# Patient Record
Sex: Female | Born: 1975 | Race: White | Hispanic: No | Marital: Married | Smoking: Current every day smoker
Health system: Southern US, Academic
[De-identification: ages and names within clinical notes are randomized; demographics above are authoritative.]

## PROBLEM LIST (undated history)

## (undated) DIAGNOSIS — F429 Obsessive-compulsive disorder, unspecified: Secondary | ICD-10-CM

## (undated) DIAGNOSIS — M539 Dorsopathy, unspecified: Secondary | ICD-10-CM

## (undated) DIAGNOSIS — F339 Major depressive disorder, recurrent, unspecified: Secondary | ICD-10-CM

## (undated) DIAGNOSIS — R6889 Other general symptoms and signs: Secondary | ICD-10-CM

## (undated) DIAGNOSIS — F4310 Post-traumatic stress disorder, unspecified: Secondary | ICD-10-CM

## (undated) DIAGNOSIS — Z8719 Personal history of other diseases of the digestive system: Secondary | ICD-10-CM

## (undated) DIAGNOSIS — E119 Type 2 diabetes mellitus without complications: Secondary | ICD-10-CM

## (undated) DIAGNOSIS — M5090 Cervical disc disorder, unspecified, unspecified cervical region: Secondary | ICD-10-CM

## (undated) DIAGNOSIS — F411 Generalized anxiety disorder: Secondary | ICD-10-CM

## (undated) DIAGNOSIS — Z973 Presence of spectacles and contact lenses: Secondary | ICD-10-CM

## (undated) DIAGNOSIS — G8929 Other chronic pain: Secondary | ICD-10-CM

## (undated) DIAGNOSIS — S0300XA Dislocation of jaw, unspecified side, initial encounter: Secondary | ICD-10-CM

## (undated) DIAGNOSIS — R790 Abnormal level of blood mineral: Secondary | ICD-10-CM

## (undated) DIAGNOSIS — E559 Vitamin D deficiency, unspecified: Secondary | ICD-10-CM

## (undated) DIAGNOSIS — R519 Headache, unspecified: Secondary | ICD-10-CM

## (undated) DIAGNOSIS — D509 Iron deficiency anemia, unspecified: Secondary | ICD-10-CM

## (undated) DIAGNOSIS — R197 Diarrhea, unspecified: Secondary | ICD-10-CM

## (undated) DIAGNOSIS — H8110 Benign paroxysmal vertigo, unspecified ear: Secondary | ICD-10-CM

## (undated) DIAGNOSIS — F32A Depression, unspecified: Secondary | ICD-10-CM

## (undated) DIAGNOSIS — K219 Gastro-esophageal reflux disease without esophagitis: Secondary | ICD-10-CM

## (undated) DIAGNOSIS — Z87898 Personal history of other specified conditions: Secondary | ICD-10-CM

## (undated) DIAGNOSIS — R7303 Prediabetes: Secondary | ICD-10-CM

## (undated) DIAGNOSIS — E538 Deficiency of other specified B group vitamins: Secondary | ICD-10-CM

## (undated) DIAGNOSIS — F419 Anxiety disorder, unspecified: Secondary | ICD-10-CM

## (undated) DIAGNOSIS — K589 Irritable bowel syndrome without diarrhea: Secondary | ICD-10-CM

## (undated) DIAGNOSIS — F410 Panic disorder [episodic paroxysmal anxiety] without agoraphobia: Secondary | ICD-10-CM

## (undated) HISTORY — DX: Prediabetes: R73.03

## (undated) HISTORY — PX: ESOPHAGOGASTRODUODENOSCOPY: SHX1529

## (undated) HISTORY — DX: Generalized anxiety disorder: F41.1

## (undated) HISTORY — DX: Cervical disc disorder, unspecified, unspecified cervical region: M50.90

## (undated) HISTORY — DX: Abnormal level of blood mineral: R79.0

## (undated) HISTORY — DX: Panic disorder (episodic paroxysmal anxiety): F41.0

## (undated) HISTORY — DX: Personal history of other specified conditions: Z87.898

## (undated) HISTORY — DX: Depression, unspecified: F32.A

## (undated) HISTORY — PX: COLONOSCOPY: WVUENDOPRO10

## (undated) HISTORY — DX: Deficiency of other specified B group vitamins: E53.8

## (undated) HISTORY — DX: Iron deficiency anemia, unspecified: D50.9

## (undated) HISTORY — DX: Dislocation of jaw, unspecified side, initial encounter: S03.00XA

## (undated) HISTORY — DX: Personal history of other diseases of the digestive system: Z87.19

## (undated) HISTORY — DX: Major depressive disorder, recurrent, unspecified: F33.9

## (undated) HISTORY — DX: Headache, unspecified: R51.9

## (undated) HISTORY — PX: HX TUBAL LIGATION: SHX77

## (undated) HISTORY — DX: Gastro-esophageal reflux disease without esophagitis: K21.9

## (undated) HISTORY — DX: Post-traumatic stress disorder, unspecified: F43.10

## (undated) HISTORY — PX: HX HYSTERECTOMY: SHX81

## (undated) HISTORY — DX: Vitamin D deficiency, unspecified: E55.9

## (undated) HISTORY — DX: Other chronic pain: G89.29

## (undated) HISTORY — DX: Obsessive-compulsive disorder, unspecified: F42.9

---
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
## 2018-11-29 IMAGING — US EXTREMITY
1 series · 13 of 13 positions shown · non-contrast
Comparison: None available.
COMPARISON: None available.

------------- REPORT [id number] -------------
EXAM:  EXTREMITY
INDICATION: Left foot suspected foreign body.
INDICATION: Left foot pain. Left foot suspected foreign body.

[Series 4: extremity · 0.05mm/px · 13 of 13 slices shown]
[im 1/13]
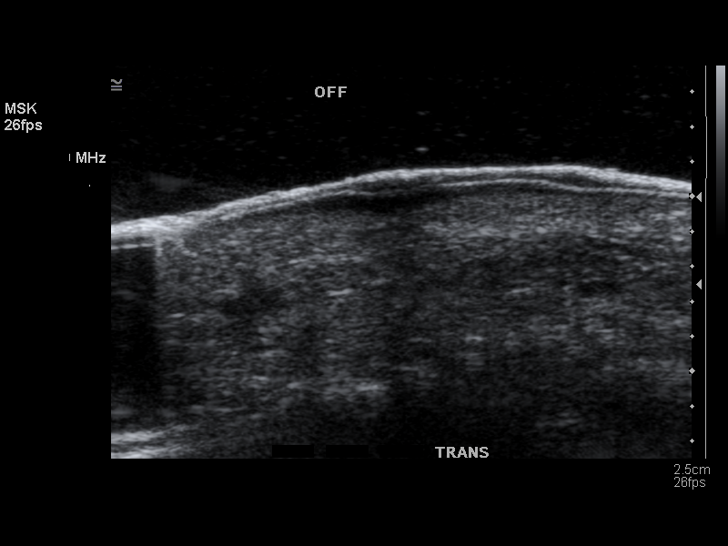
[im 2/13]
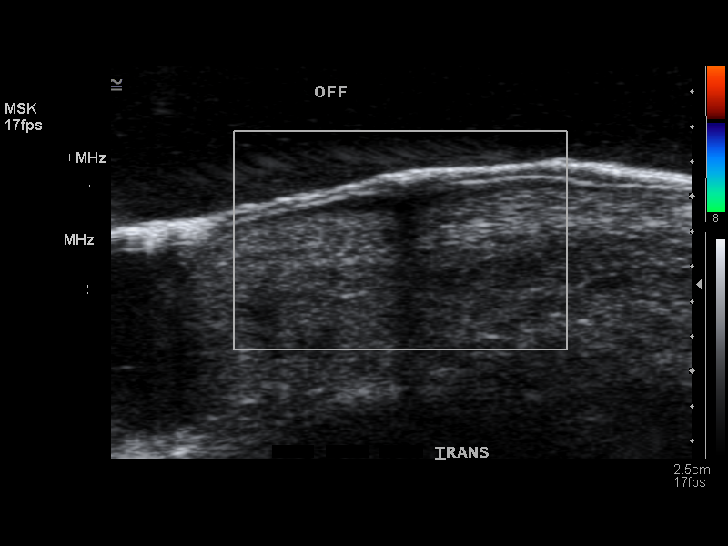
[im 3/13]
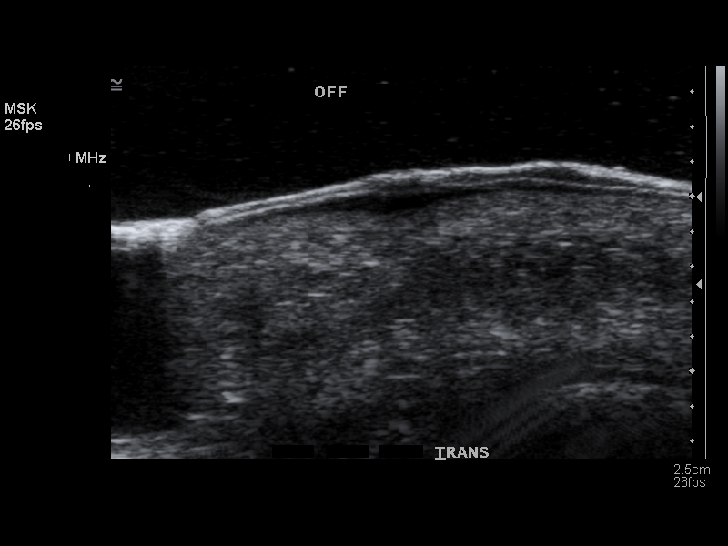
[im 4/13]
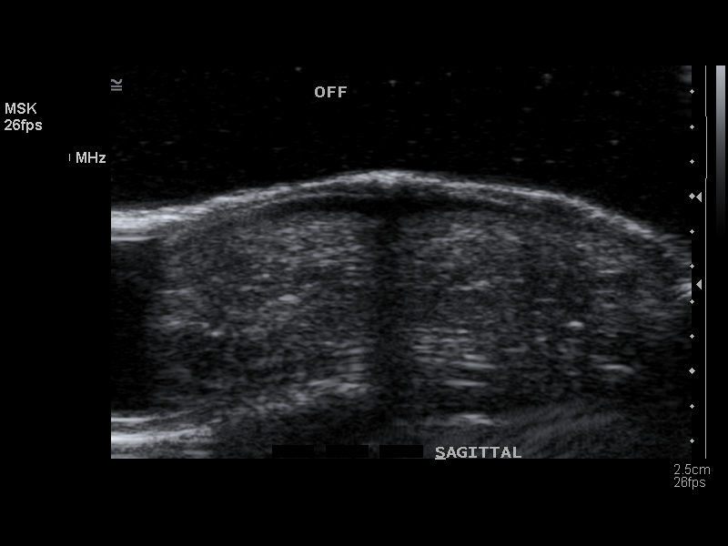
[im 5/13]
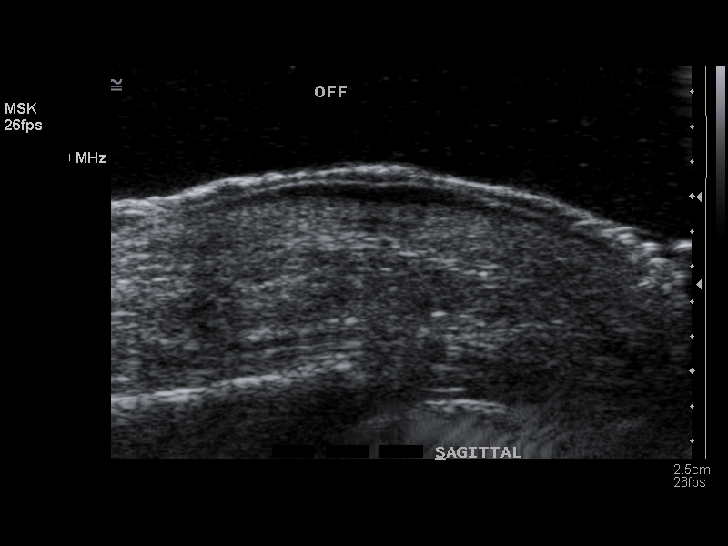
[im 6/13]
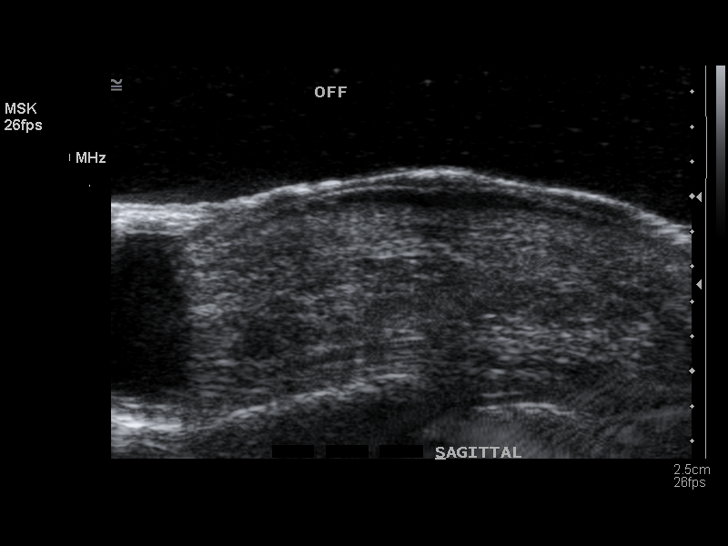
[im 7/13]
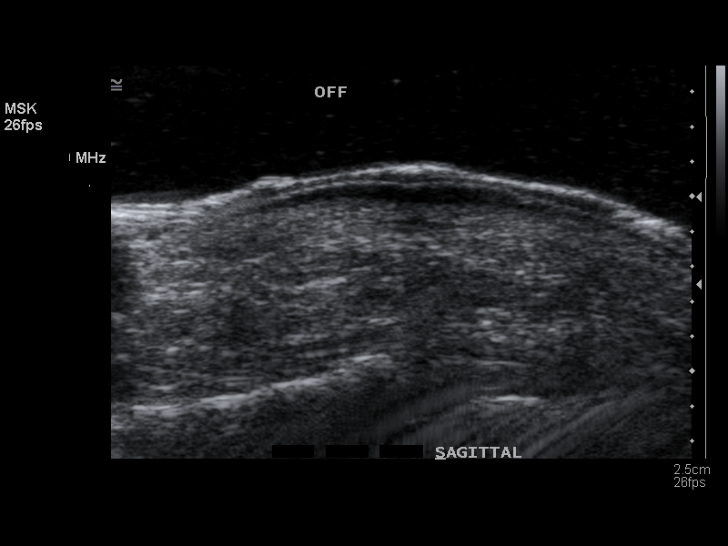
[im 8/13]
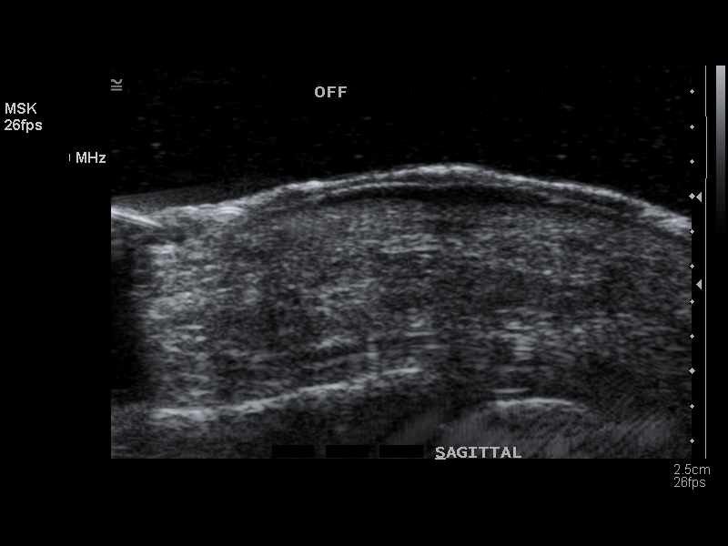
[im 9/13]
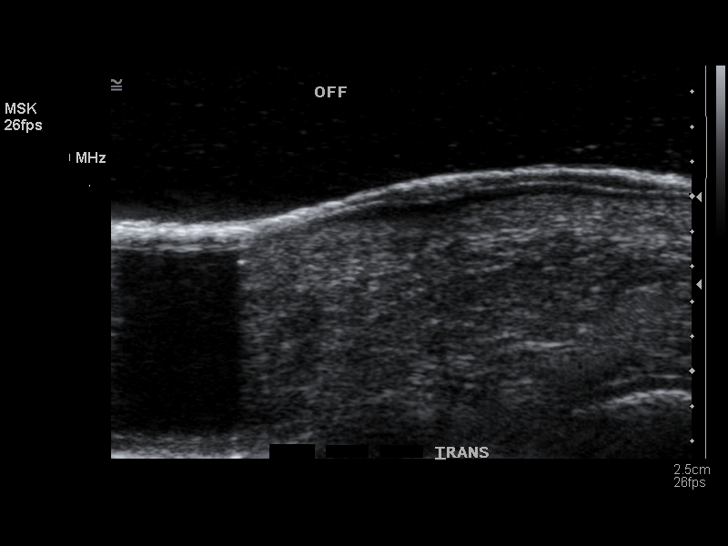
[im 10/13]
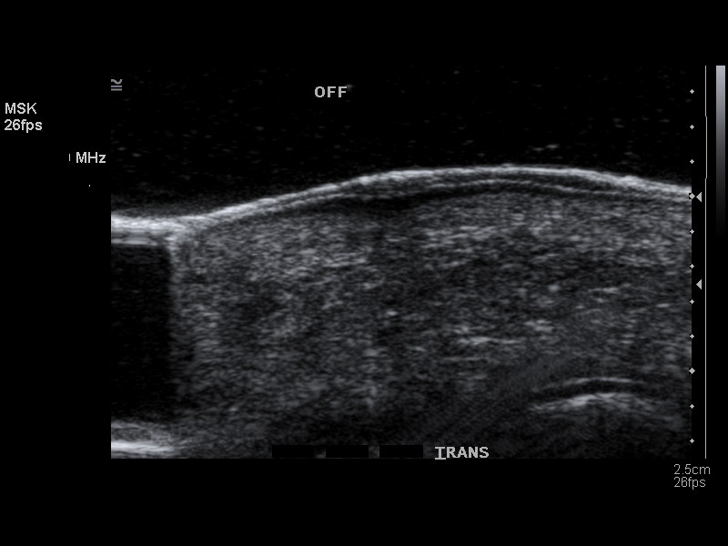
[im 11/13]
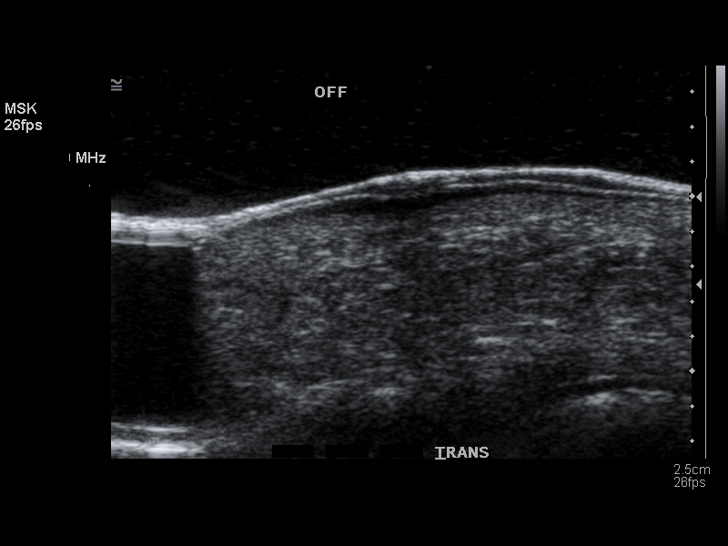
[im 12/13]
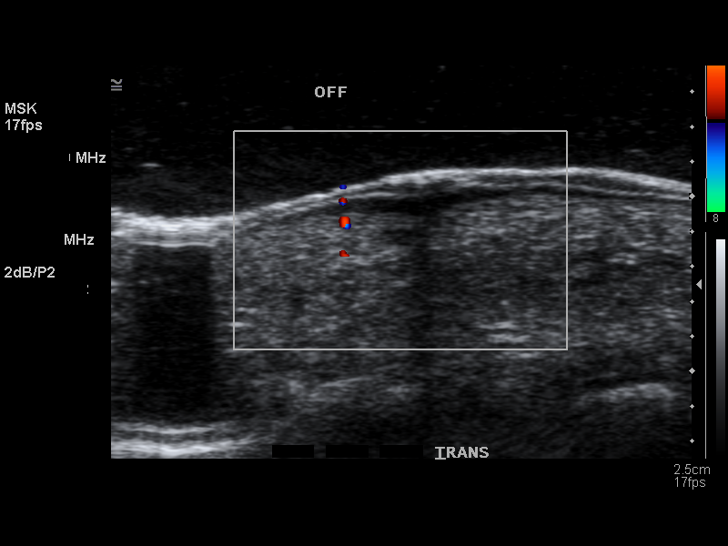
[im 13/13]
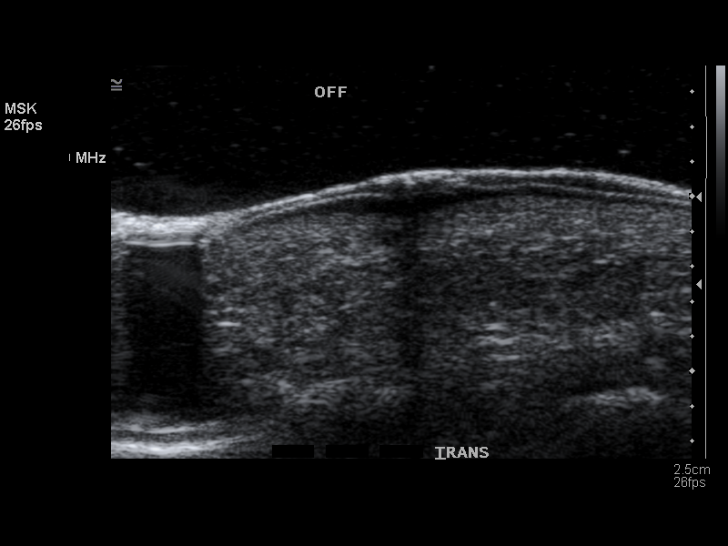

[13 of 13 positions shown; findings below may reference images not displayed]

FINDINGS: Limited sonographic evaluation of the left foot plantar surface, at the level of the 3rd and 4th metatarsophalangeal joints, demonstrates normal soft tissues. No suspicious solid or cystic mass is seen. There is no definite foreign body.
IMPRESSION: Unremarkable limited exam. Continued clinical follow-up is recommended.

------------- REPORT [id number] -------------
EXAM:  EXTREMITY
FINDINGS: Limited sonographic evaluation of the left foot plantar surface, at the level of the 3rd and 4th metatarsophalangeal joints, demonstrates normal soft tissues. No suspicious solid or cystic mass is seen. There is no definite foreign body.
IMPRESSION: Unremarkable limited exam. Continued clinical follow-up is recommended.

## 2018-11-29 IMAGING — CR XRAY FOOT COMPLETE LT
1 series · 3 of 3 positions shown · non-contrast
Comparison: None available.

EXAM:  XRAY FOOT COMPLETE LT
INDICATION: Left foot pain. Suspected foreign body.

[Series 1: view not recorded · 0.17mm/px · 3 of 3 slices shown]
[im 1/3]
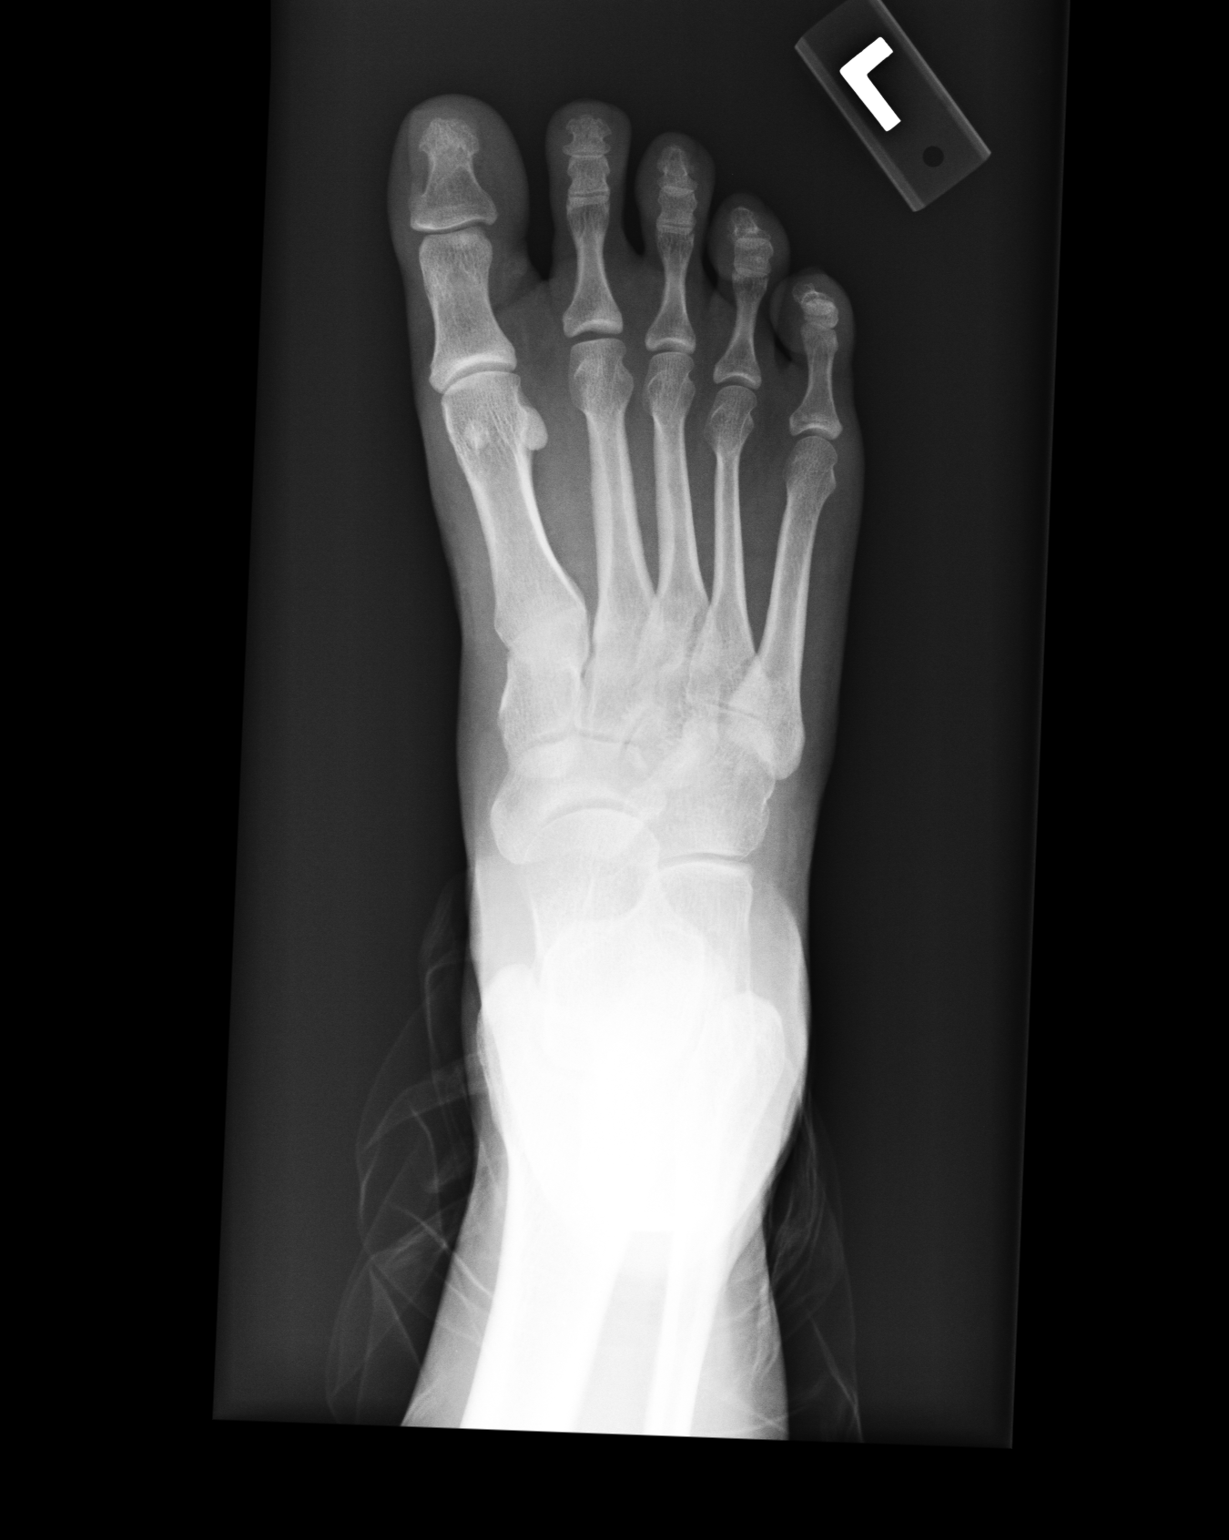
[im 2/3]
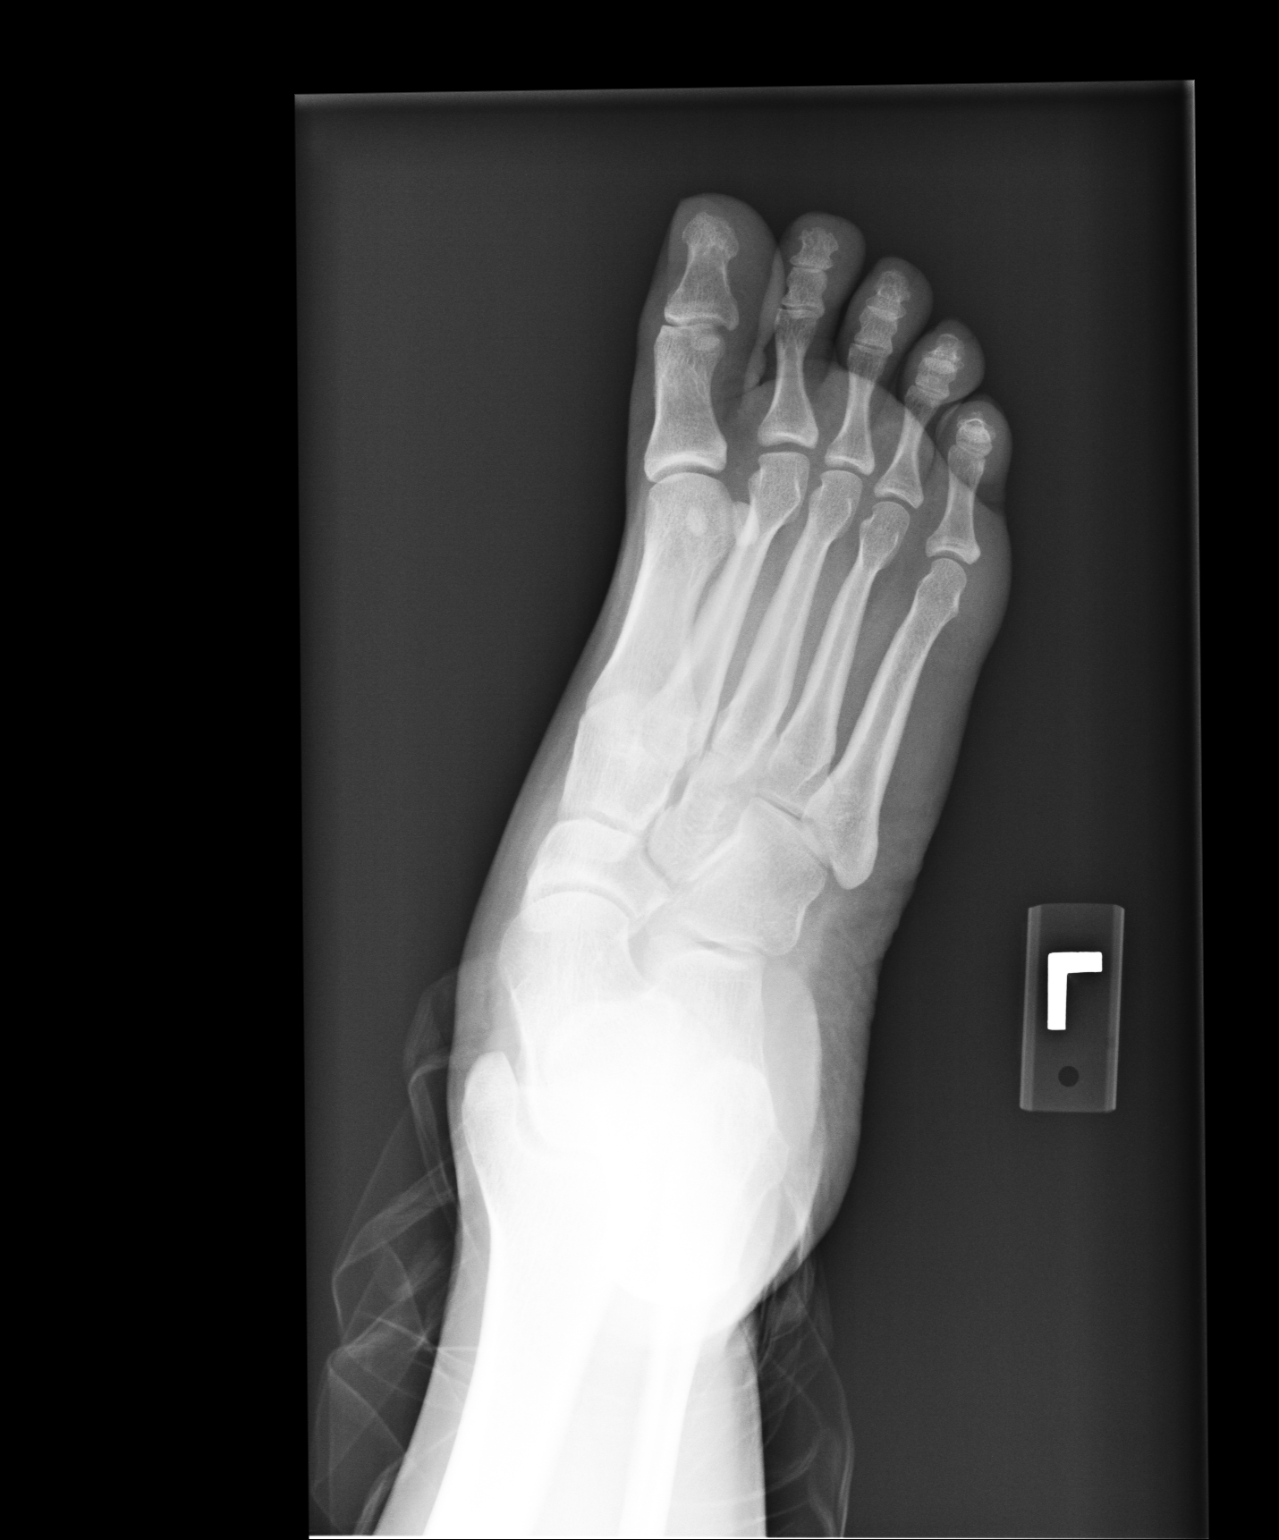
[im 3/3]
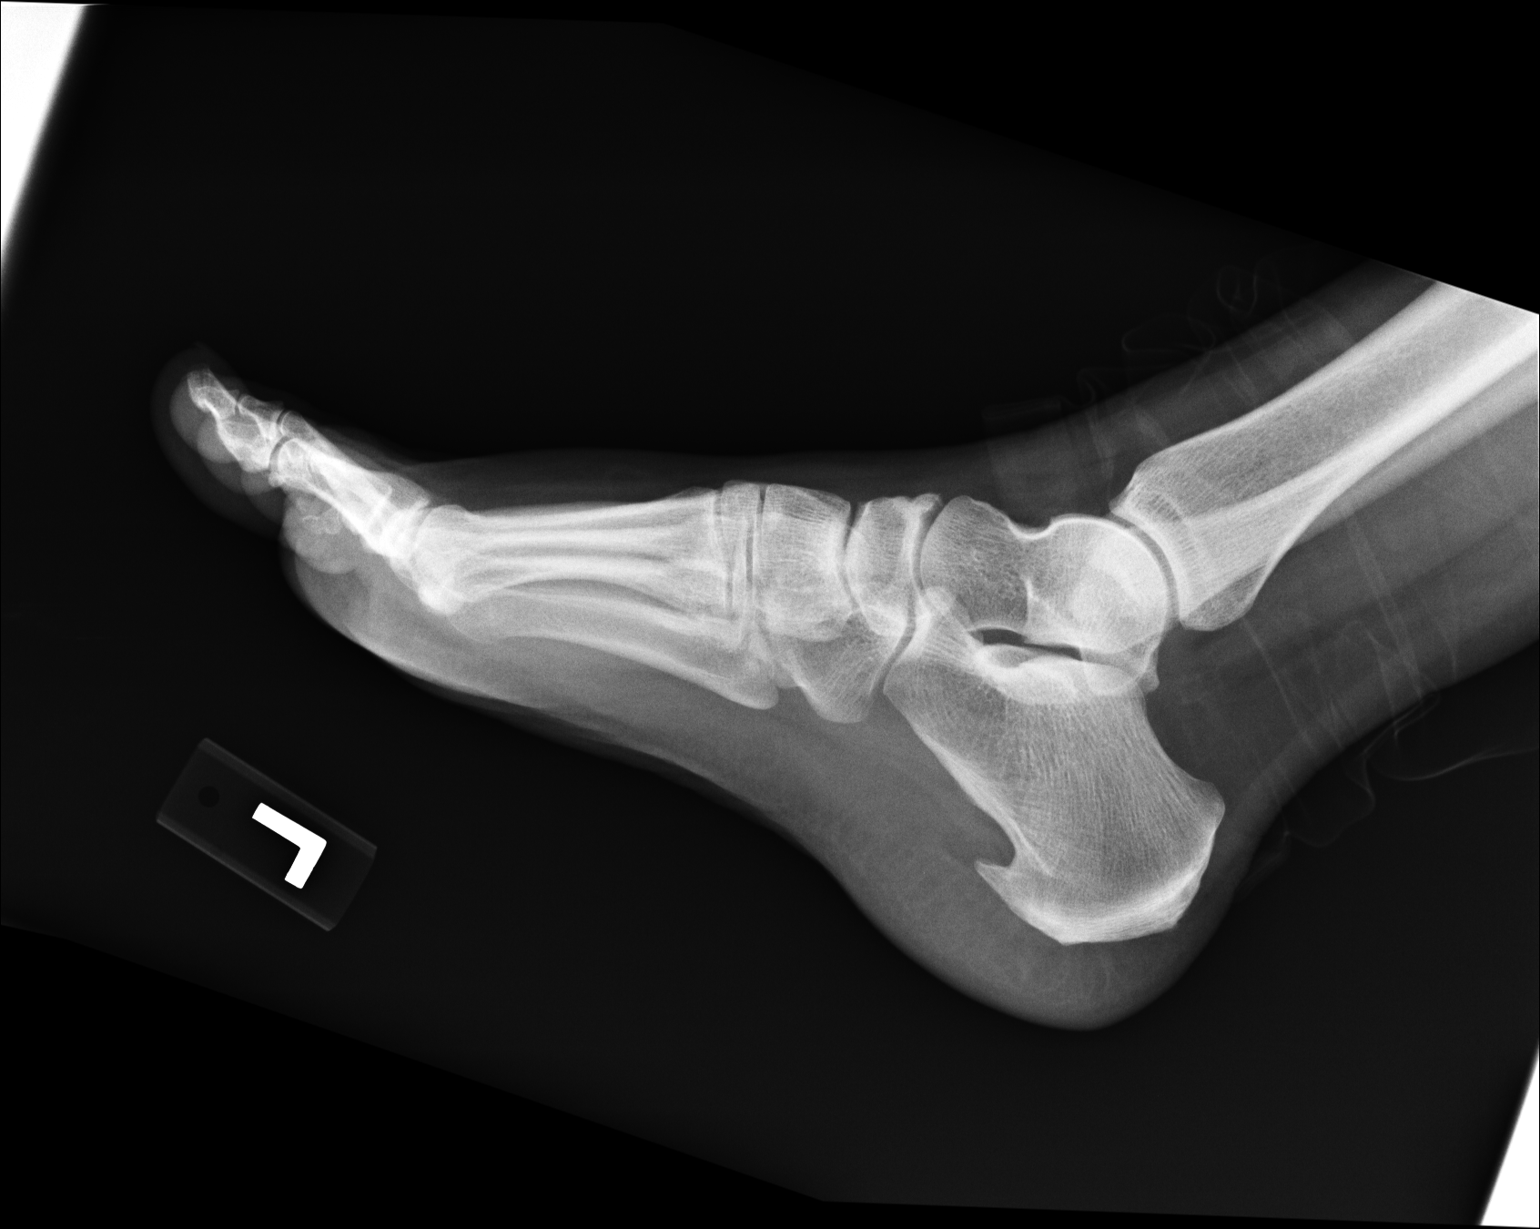

[3 of 3 positions shown; findings below may reference images not displayed]

FINDINGS: There is no acute fracture or subluxation. No significant arthritic changes seen. There is no radiopaque foreign body. A heel spur is noted. There is no soft tissue abnormality.
IMPRESSION: Unremarkable exam.

## 2019-10-04 HISTORY — PX: ESOPHAGOGASTRODUODENOSCOPY: SHX1529

## 2019-10-04 HISTORY — PX: COLONOSCOPY: WVUENDOPRO10

## 2020-02-05 IMAGING — MG 3D SCREENING MAMMO BIL W/CAD
5 series · 8 of 24 positions shown · non-contrast
Comparison: None.  This is a baseline exam.

------------- REPORT [id number] -------------
[facility]
[address]
Daina Ms.DEDIOS, JOSHUAH:
We wish to report the following on your recent mammography examination. We are sending a report to your referring physician or other health care provider. 
(       Normal/Negative:
No evidence of cancer.
This statement is mandated by the [place], Department of Health.
Your examination was performed by one of our technologists, who are registered radiological technologists and also specially certified in mammography:
___
Parlak, Edaly (M)
Nepomuceno, Martinez (M)

Your mammogram was interpreted by our radiologist.
( 
Sofeine Made, M.D.
(Annual Breast Examination by a physician or other health care provider
(Annual Mammography Screening beginning at age 40
(Monthly Breast Self Examination
------------- REPORT [id number] -------------
CLEAR, YOUNGZY
EMMANUELLA HAGEN,FNP
EXAM:  3D BILATERAL ANNUAL SCREENING DIGITAL MAMMOGRAM WITH CAD AND TOMOSYNTHESIS
INDICATION: Screening.

[R]
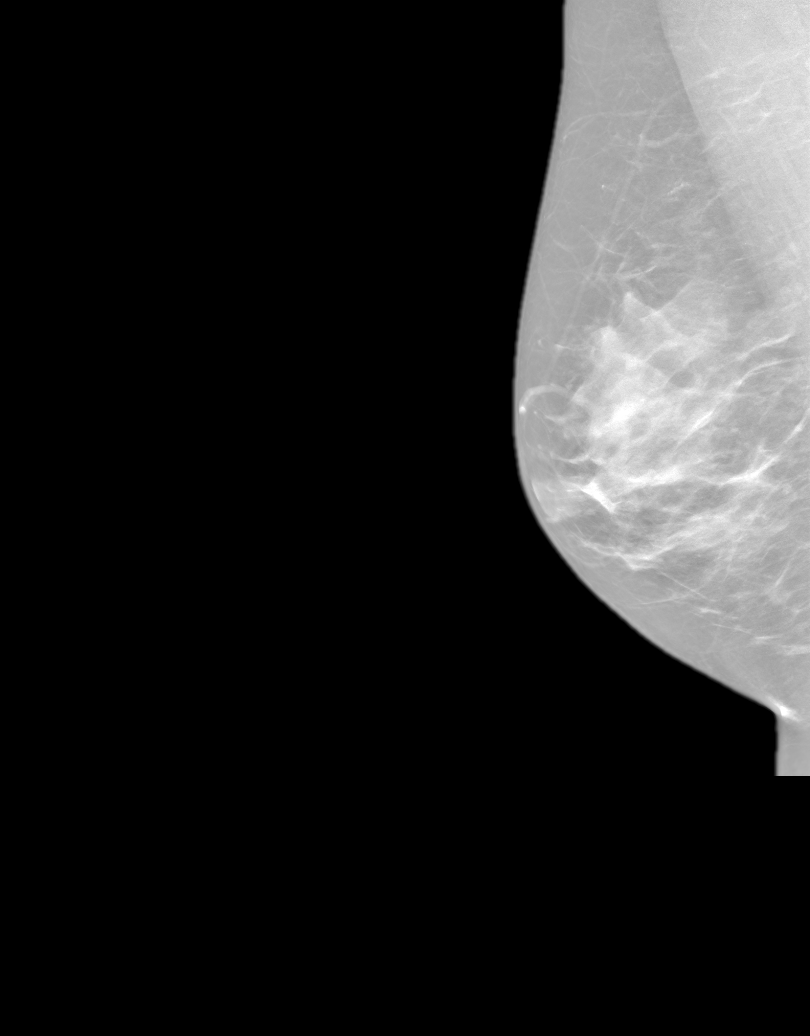

[Series 3519: R CC · right · 2 of 4 slices shown]
[im 1/4]
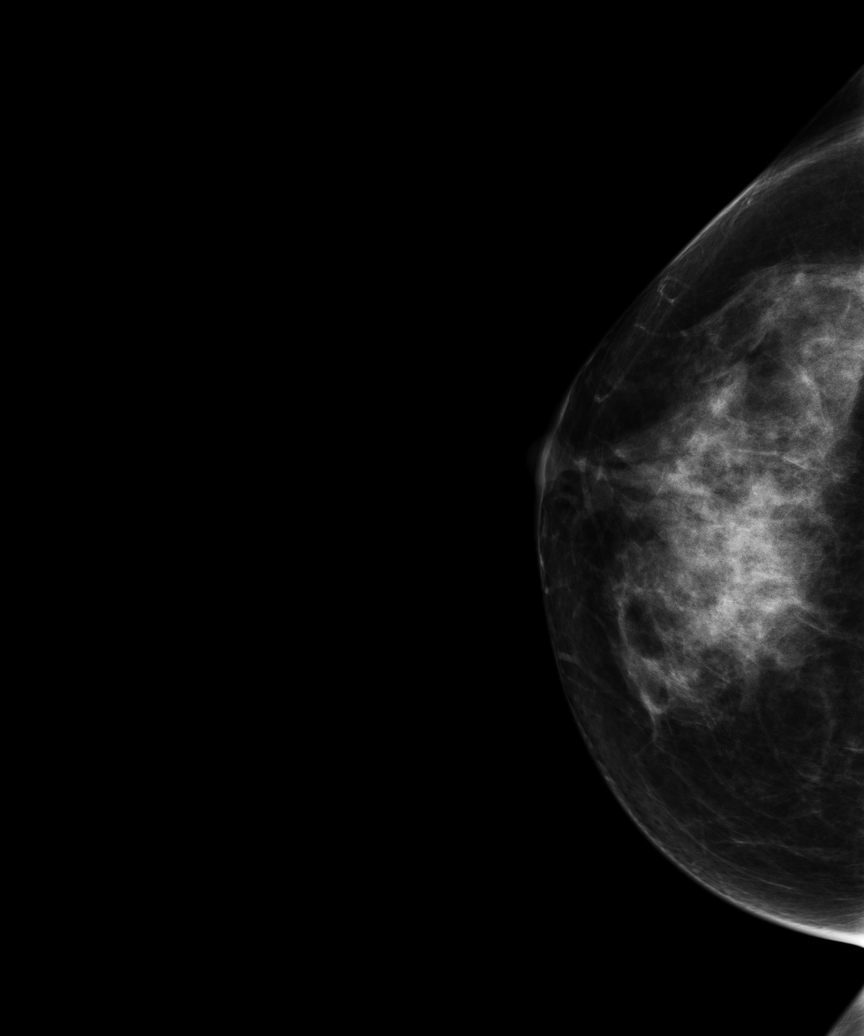
[im 4/4]
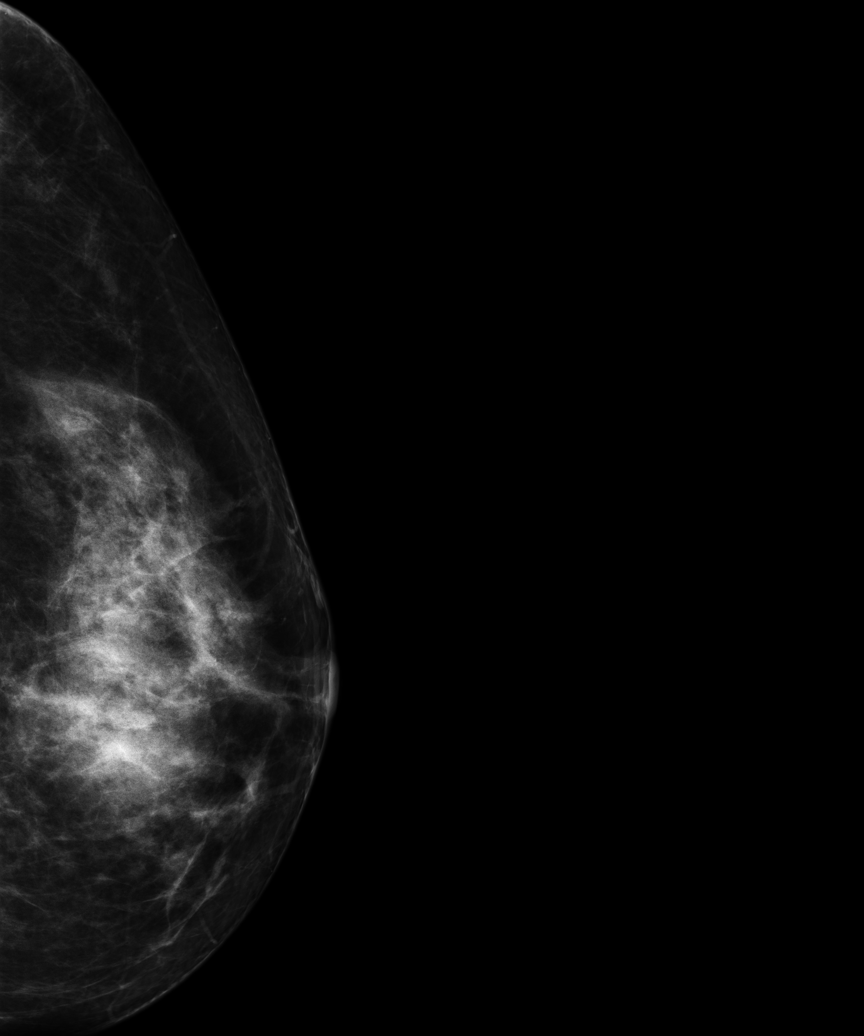

[Series 3521: 3D SCREENING MAMMO BIL W/CAD · 2 acquisitions, 3 frames shown (1 of 2)]
[im 1/2]
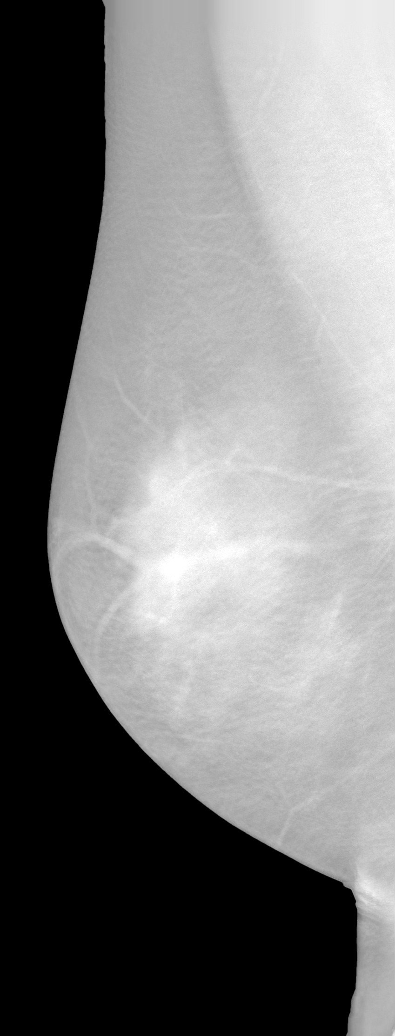
[im 2/2]
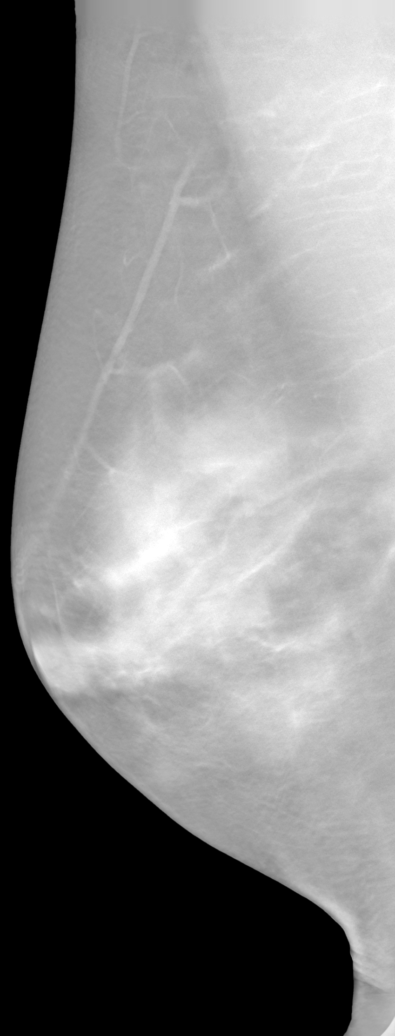
[im 2/2]
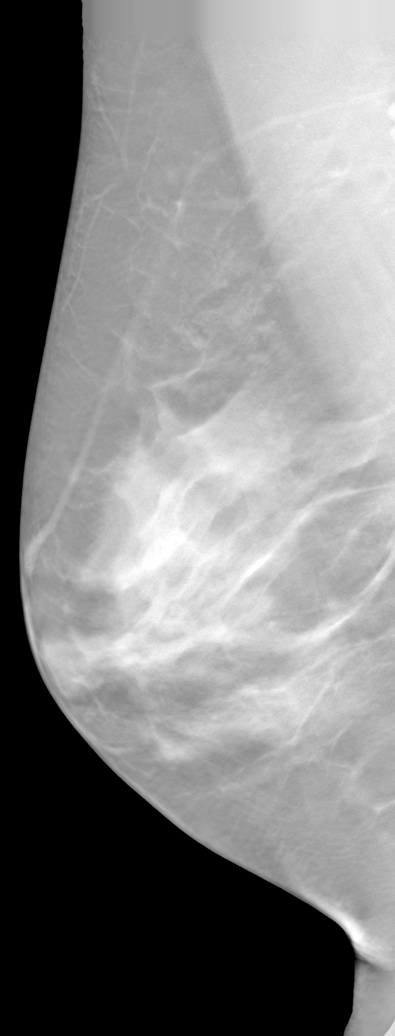

[L]
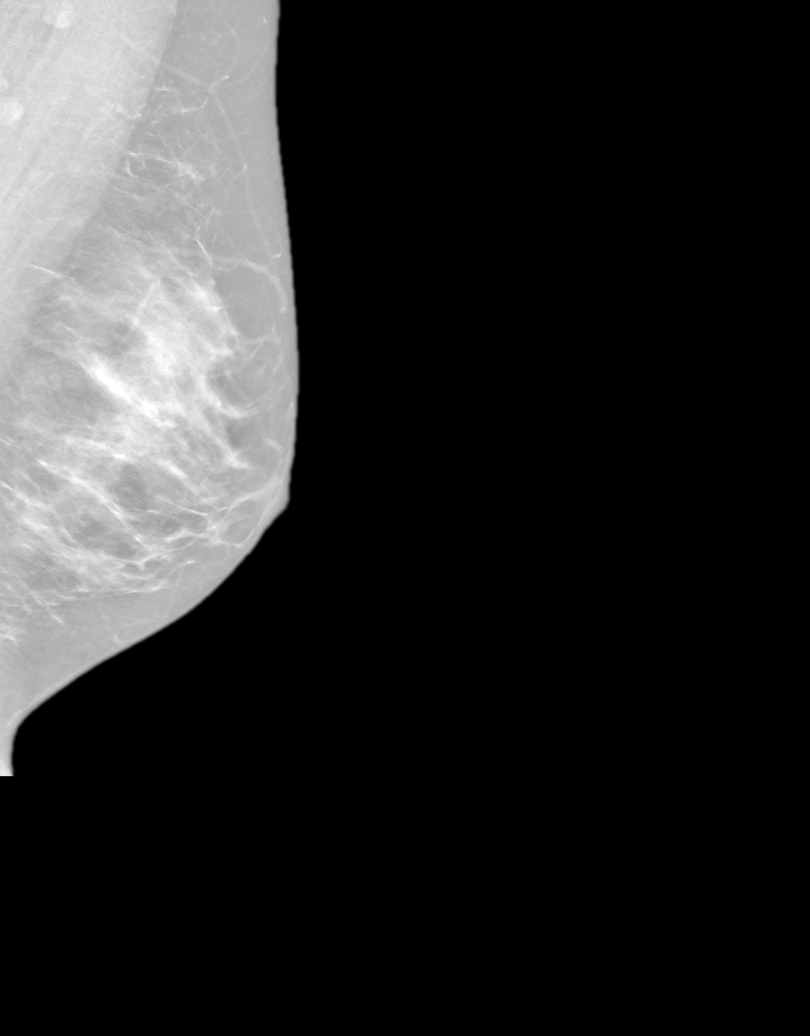

[3D SCREENING MAMMO BIL W/CAD (2 of 2) · tomo slice 14/86.0]
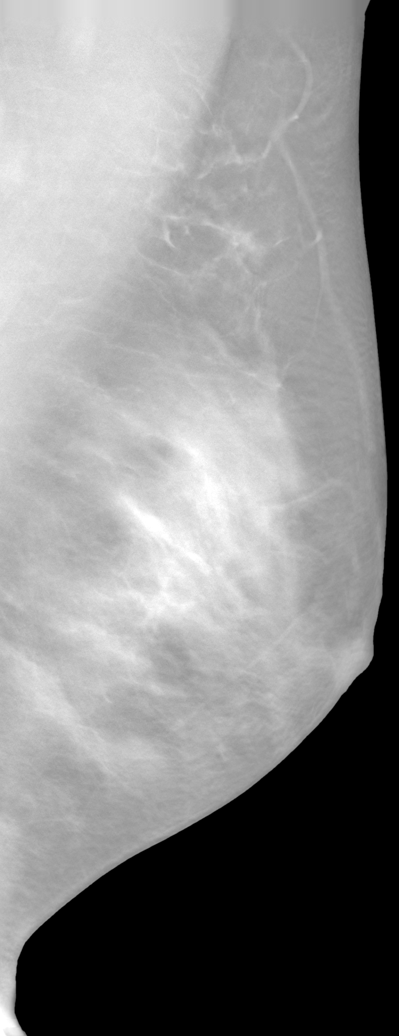

[8 of 24 positions shown; findings below may reference images not displayed]

FINDINGS: Breast parenchyma is heterogeneously dense.  There is no mass or suspicious cluster of microcalcifications.  There is no architectural distortion, skin thickening or nipple retraction.
IMPRESSION: 1.  BIRADS 2-Benign findings. Patient has been added in a reminder system with a target date for the next screening mammography.

2.  DENSITY CODE – C (Heterogeneously dense). 

Final Assessment Code:

Bi-Rads 2 

BI-RADS 0
Need additional imaging evaluation.

BI-RADS 1
Negative mammogram.

BI-RADS 2
Benign finding.

BI-RADS 3
Probably benign finding; short-interval follow-up suggested.

BI-RADS 4
Suspicious abnormality; biopsy should be considered.

BI-RADS 5
Highly suggestive of malignancy; appropriate action should be taken.

BI-RADS 6
Known biopsy-proven malignancy; appropriate action should be taken. 

NOTE:
In compliance with Federal regulations, the results of this mammogram are being sent to the patient.

## 2020-07-03 HISTORY — PX: HX HYSTERECTOMY: SHX81

## 2021-12-06 ENCOUNTER — Ambulatory Visit (HOSPITAL_COMMUNITY)
Admission: RE | Admit: 2021-12-06 | Discharge: 2021-12-06 | Disposition: A | Discharge status: Home / Self Care | Payer: Self-pay | Source: Ambulatory Visit

## 2021-12-20 ENCOUNTER — Other Ambulatory Visit: Payer: BC Managed Care – PPO | Attending: FAMILY PRACTICE

## 2021-12-20 ENCOUNTER — Other Ambulatory Visit: Payer: Self-pay

## 2021-12-20 DIAGNOSIS — F419 Anxiety disorder, unspecified: Secondary | ICD-10-CM

## 2021-12-20 DIAGNOSIS — R519 Headache, unspecified: Secondary | ICD-10-CM

## 2021-12-20 DIAGNOSIS — G8929 Other chronic pain: Secondary | ICD-10-CM | POA: Insufficient documentation

## 2021-12-20 DIAGNOSIS — K219 Gastro-esophageal reflux disease without esophagitis: Secondary | ICD-10-CM | POA: Insufficient documentation

## 2021-12-20 DIAGNOSIS — R319 Hematuria, unspecified: Secondary | ICD-10-CM | POA: Insufficient documentation

## 2021-12-20 DIAGNOSIS — R42 Dizziness and giddiness: Secondary | ICD-10-CM | POA: Insufficient documentation

## 2021-12-20 DIAGNOSIS — E559 Vitamin D deficiency, unspecified: Secondary | ICD-10-CM | POA: Insufficient documentation

## 2021-12-20 DIAGNOSIS — F321 Major depressive disorder, single episode, moderate: Secondary | ICD-10-CM | POA: Insufficient documentation

## 2021-12-20 LAB — BASIC METABOLIC PANEL
ANION GAP: 7 mmol/L — ABNORMAL LOW (ref 10–20)
BUN/CREA RATIO: 13 (ref 6–22)
BUN: 9 mg/dL (ref 7–25)
CALCIUM: 9.4 mg/dL (ref 8.6–10.3)
CHLORIDE: 106 mmol/L (ref 98–107)
CO2 TOTAL: 24 mmol/L (ref 21–31)
CREATININE: 0.72 mg/dL (ref 0.60–1.30)
ESTIMATED GFR: 106 mL/min/{1.73_m2} (ref >59)
GLUCOSE: 75 mg/dL (ref 74–109)
OSMOLALITY, CALCULATED: 271 mOsm/kg (ref 270–290)
POTASSIUM: 4 mmol/L (ref 3.5–5.1)
SODIUM: 137 mmol/L (ref 136–145)

## 2021-12-20 LAB — CBC WITH DIFF
BASOPHIL #: 0.1 10*3/uL (ref 0.00–2.50)
BASOPHIL %: 1 % (ref 0–3)
EOSINOPHIL #: 0.2 10*3/uL (ref 0.00–2.40)
EOSINOPHIL %: 2 % (ref 0–7)
HCT: 45.7 % (ref 37.0–47.0)
HGB: 15.5 g/dL (ref 12.5–16.0)
LYMPHOCYTE #: 2.2 10*3/uL (ref 2.10–11.00)
LYMPHOCYTE %: 26 % (ref 25–45)
MCH: 29.9 pg (ref 27.0–32.0)
MCHC: 33.8 g/dL (ref 32.0–36.0)
MCV: 88.4 fL (ref 78.0–99.0)
MONOCYTE #: 0.6 10*3/uL (ref 0.00–4.10)
MONOCYTE %: 7 % (ref 0–12)
MPV: 10.4 fL (ref 7.4–10.4)
NEUTROPHIL #: 5.3 10*3/uL (ref 4.10–29.00)
NEUTROPHIL %: 63 % (ref 40–76)
PLATELETS: 204 10*3/uL (ref 140–440)
RBC: 5.17 10*6/uL (ref 4.20–5.40)
RDW: 13.5 % (ref 11.6–14.8)
WBC: 8.4 10*3/uL (ref 4.0–10.5)
WBCS UNCORRECTED: 8.4 10*3/uL

## 2021-12-20 LAB — THYROID STIMULATING HORMONE (SENSITIVE TSH): TSH: 1.324 u[IU]/mL (ref 0.450–5.330)

## 2021-12-20 LAB — URINALYSIS, MICROSCOPIC
RBCS: 1 /hpf (ref <4)
SQUAMOUS EPITHELIAL: 2 /hpf (ref <28)

## 2021-12-20 LAB — VITAMIN D 25 TOTAL: VITAMIN D: 34 ng/mL (ref 30–100)

## 2021-12-20 LAB — THYROXINE, TOTAL T4: T4 TOTAL: 9.9 ug/dL (ref 6.1–12.2)

## 2021-12-20 LAB — CREATINE KINASE (CK), TOTAL, SERUM: CREATINE KINASE: 73 U/L (ref 30–223)

## 2021-12-20 LAB — SEDIMENTATION RATE: ERYTHROCYTE SEDIMENTATION RATE (ESR): 30 mm/hr — ABNORMAL HIGH (ref <=20)

## 2021-12-21 LAB — URINE CULTURE: URINE CULTURE: 2000 — AB

## 2021-12-21 LAB — SS-B/LA ANTIBODIES, IGG, SERUM: SS-B/LA ANTIBODIES, IGG, QUALITATIVE: NEGATIVE

## 2021-12-21 LAB — SS-A/RO ANTIBODIES, IGG, SERUM: SS-A/RO, ANTIBODIES, IGG QUALITATIVE: NEGATIVE

## 2021-12-23 ENCOUNTER — Other Ambulatory Visit (HOSPITAL_COMMUNITY): Payer: Self-pay | Admitting: Surgery

## 2021-12-23 DIAGNOSIS — R928 Other abnormal and inconclusive findings on diagnostic imaging of breast: Secondary | ICD-10-CM

## 2021-12-31 ENCOUNTER — Other Ambulatory Visit (HOSPITAL_COMMUNITY): Payer: Self-pay

## 2022-01-04 ENCOUNTER — Other Ambulatory Visit: Payer: Self-pay

## 2022-01-04 ENCOUNTER — Inpatient Hospital Stay
Admission: RE | Admit: 2022-01-04 | Discharge: 2022-01-04 | Disposition: A | Discharge status: Home / Self Care | Payer: BC Managed Care – PPO | Source: Ambulatory Visit | Attending: Surgery | Admitting: Surgery

## 2022-01-04 DIAGNOSIS — R928 Other abnormal and inconclusive findings on diagnostic imaging of breast: Secondary | ICD-10-CM

## 2022-01-04 MED ORDER — GADOBUTROL 10 MMOL/10 ML (1 MMOL/ML) INTRAVENOUS SOLUTION
10.0000 mL | INTRAVENOUS | Status: AC
Start: 2022-01-04 — End: 2022-01-04
  Administered 2022-01-04: 7 mL via INTRAVENOUS

## 2022-01-05 ENCOUNTER — Emergency Department (EMERGENCY_DEPARTMENT_HOSPITAL): Payer: BC Managed Care – PPO

## 2022-01-05 ENCOUNTER — Emergency Department
Admission: EM | Admit: 2022-01-05 | Discharge: 2022-01-05 | Disposition: A | Discharge status: Home / Self Care | Payer: BC Managed Care – PPO | Attending: Family | Admitting: Family

## 2022-01-05 ENCOUNTER — Other Ambulatory Visit: Payer: Self-pay

## 2022-01-05 DIAGNOSIS — R42 Dizziness and giddiness: Secondary | ICD-10-CM

## 2022-01-05 DIAGNOSIS — R519 Headache, unspecified: Secondary | ICD-10-CM

## 2022-01-05 DIAGNOSIS — F410 Panic disorder [episodic paroxysmal anxiety] without agoraphobia: Secondary | ICD-10-CM | POA: Insufficient documentation

## 2022-01-05 LAB — COMPREHENSIVE METABOLIC PANEL, NON-FASTING
ALBUMIN/GLOBULIN RATIO: 1 (ref 0.8–1.4)
ALBUMIN: 3.9 g/dL (ref 3.4–5.0)
ALKALINE PHOSPHATASE: 85 U/L (ref 46–116)
ALT (SGPT): 25 U/L (ref <=78)
ANION GAP: 10 mmol/L (ref 10–20)
AST (SGOT): 15 U/L (ref 15–37)
BILIRUBIN TOTAL: 0.4 mg/dL (ref 0.2–1.0)
BUN/CREA RATIO: 13
BUN: 11 mg/dL (ref 7–18)
CALCIUM, CORRECTED: 9.6 mg/dL
CALCIUM: 9.5 mg/dL (ref 8.5–10.1)
CHLORIDE: 100 mmol/L (ref 98–107)
CO2 TOTAL: 25 mmol/L (ref 21–32)
CREATININE: 0.87 mg/dL (ref 0.55–1.02)
ESTIMATED GFR: 85 mL/min/{1.73_m2} (ref >59)
GLOBULIN: 4.1
GLUCOSE: 100 mg/dL (ref 74–106)
OSMOLALITY, CALCULATED: 270 mOsm/kg (ref 270–290)
POTASSIUM: 4 mmol/L (ref 3.5–5.1)
PROTEIN TOTAL: 8 g/dL (ref 6.4–8.2)
SODIUM: 135 mmol/L — ABNORMAL LOW (ref 136–145)

## 2022-01-05 LAB — CBC WITH DIFF
BASOPHIL #: 0.03 10*3/uL (ref 0.00–2.50)
BASOPHIL %: 0 % (ref 0–3)
EOSINOPHIL #: 0.23 10*3/uL (ref 0.00–2.40)
EOSINOPHIL %: 3 % (ref 0–7)
HCT: 45.2 % (ref 37.0–47.0)
HGB: 16 g/dL (ref 12.5–16.0)
LYMPHOCYTE #: 2.22 10*3/uL (ref 2.10–11.00)
LYMPHOCYTE %: 24 % — ABNORMAL LOW (ref 25–45)
MCH: 31.2 pg (ref 27.0–32.0)
MCHC: 35.3 g/dL (ref 32.0–36.0)
MCV: 88.3 fL (ref 78.0–99.0)
MONOCYTE #: 0.57 10*3/uL (ref 0.00–4.10)
MONOCYTE %: 6 % (ref 0–12)
MPV: 8.9 fL (ref 7.4–10.4)
NEUTROPHIL #: 6.03 10*3/uL (ref 4.10–29.00)
NEUTROPHIL %: 66 % (ref 40–76)
PLATELETS: 234 10*3/uL (ref 140–440)
RBC: 5.12 10*6/uL (ref 4.20–5.40)
RDW: 15.8 % — ABNORMAL HIGH (ref 11.6–14.8)
WBC: 9.1 10*3/uL (ref 4.0–10.5)

## 2022-01-05 MED ORDER — MECLIZINE 25 MG TABLET
25.0000 mg | ORAL_TABLET | ORAL | Status: AC
Start: 2022-01-05 — End: 2022-01-05
  Administered 2022-01-05: 25 mg via ORAL

## 2022-01-05 MED ORDER — NAPROXEN 500 MG TABLET
500.0000 mg | ORAL_TABLET | Freq: Two times a day (BID) | ORAL | 0 refills | Status: DC
Start: 2022-01-05 — End: 2022-09-02

## 2022-01-05 MED ORDER — MECLIZINE 25 MG TABLET
25.0000 mg | ORAL_TABLET | Freq: Three times a day (TID) | ORAL | 0 refills | Status: DC | PRN
Start: 2022-01-05 — End: 2023-09-06

## 2022-01-05 MED ORDER — BUSPIRONE 10 MG TABLET
10.0000 mg | ORAL_TABLET | Freq: Two times a day (BID) | ORAL | 0 refills | Status: DC
Start: 2022-01-05 — End: 2022-09-02

## 2022-01-05 MED ORDER — LORAZEPAM 1 MG TABLET
1.0000 mg | ORAL_TABLET | ORAL | Status: AC
Start: 2022-01-05 — End: 2022-01-05
  Administered 2022-01-05: 1 mg via ORAL

## 2022-01-05 MED ORDER — KETOROLAC 60 MG/2 ML INTRAMUSCULAR SOLUTION
60.0000 mg | INTRAMUSCULAR | Status: AC
Start: 2022-01-05 — End: 2022-01-05
  Administered 2022-01-05: 60 mg via INTRAMUSCULAR

## 2022-01-05 MED ORDER — LORAZEPAM 1 MG TABLET
ORAL_TABLET | ORAL | Status: AC
Start: 2022-01-05 — End: 2022-01-05
  Filled 2022-01-05: qty 1

## 2022-01-05 MED ORDER — MECLIZINE 25 MG TABLET
ORAL_TABLET | ORAL | Status: AC
Start: 2022-01-05 — End: 2022-01-05
  Filled 2022-01-05: qty 1

## 2022-01-05 MED ORDER — KETOROLAC 60 MG/2 ML INTRAMUSCULAR SOLUTION
INTRAMUSCULAR | Status: AC
Start: 2022-01-05 — End: 2022-01-05
  Filled 2022-01-05: qty 2

## 2022-01-05 NOTE — ED Nurses Note (Signed)
Patient discharged home with family.  AVS reviewed with patient/care giver.  A written copy of the AVS and discharge instructions was given to the patient/care giver. Scripts escribed to preferred pharmacy. Questions sufficiently answered as needed.  Patient/care giver encouraged to follow up with PCP as indicated.  In the event of an emergency, patient/care giver instructed to call 911 or go to the nearest emergency room.

## 2022-01-05 NOTE — ED Triage Notes (Addendum)
Complains of chronic dizziness for the last year.   For the last two weeks, had had headache and dizziness almost every day.   Headache moves in location.   Has Rx glasses but not wearing at this time.   Nausea.  No vomiting.  Saw cardiologist on Monday and was told not cardiac related.

## 2022-01-05 NOTE — ED Nurses Note (Signed)
Patient reports headache rating it 9/10. Provider made aware and new orders given.

## 2022-01-05 NOTE — ED Provider Notes (Signed)
[facility] Emergency Department  ED Primary Provider Note  History of Present Illness   Chief Complaint   Patient presents with   . Dizziness     Arrival: The patient arrived by Car    Latoya Harrison is a 46 y.o. female who had concerns including Dizziness.  Pt states bad headaches, dizziness, and anxiety for a year .     Review of Systems   Constitutional: No fever, chills or weakness   Skin: No rash or diaphoresis  HENT: +  headaches, no  congestion  Eyes: No vision changes or photophobia   Cardio: No chest pain, palpitations or leg swelling   Respiratory: No cough, wheezing or SOB  GI:  No nausea, vomiting or stool changes  GU:  No dysuria, hematuria, or increased frequency  MSK: No muscle aches, joint or back pain  Neuro: No seizures, LOC, numbness, tingling, or focal weakness, + dizziness.   Psychiatric: No depression, SI or substance abuse, + anxiety   All other systems reviewed and are negative.    Historical Data   History Reviewed This Encounter:  All noted and reviewed.     Physical Exam   ED Triage Vitals [01/05/22 1410]   BP (Non-Invasive) 120/80   Heart Rate 89   Respiratory Rate 18   Temperature 36.3 C (97.4 F)   SpO2 95 %   Weight 76.7 kg (169 lb)   Height 1.6 m ('5\' 3"'$ )       Constitutional:  46 y.o. female who appears in no distress. Normal color, no cyanosis.   HENT:   Head: Normocephalic and atraumatic.   Mouth/Throat: Oropharynx is clear and moist.   Eyes: EOMI, PERRL   Neck: Trachea midline. Neck supple.  Cardiovascular: RRR, No murmurs, rubs or gallops. Intact distal pulses.  Pulmonary/Chest: BS equal bilaterally. No respiratory distress. No wheezes, rales or chest tenderness.   Abdominal: Bowel sounds present and normal. Abdomen soft, no tenderness, no rebound and no guarding.  Back: No midline spinal tenderness, no paraspinal tenderness, no CVA tenderness.           Musculoskeletal: No edema, tenderness or deformity.  Skin: warm and dry. No  rash, erythema, pallor or cyanosis  Psychiatric: normal mood and affect. Behavior is normal.   Neurological: Patient keenly alert and responsive, easily able to raise eyebrows, facial muscles/expressions symmetric, speaking in fluent sentences, moving all extremities equally and fully, normal gait  Patient Data     Labs Ordered/Reviewed   COMPREHENSIVE METABOLIC PANEL, NON-FASTING - Abnormal; Notable for the following components:       Result Value    SODIUM 135 (*)     All other components within normal limits    Narrative:     Estimated Glomerular Filtration Rate (eGFR) is calculated using the CKD-EPI (2021) equation, intended for patients 59 years of age and older. If gender is not documented or "unknown", there will be no eGFR calculation.   CBC WITH DIFF - Abnormal; Notable for the following components:    RDW 15.8 (*)     LYMPHOCYTE % 24 (*)     All other components within normal limits   CBC/DIFF    Narrative:     The following orders were created for panel order CBC/DIFF.  Procedure                               Abnormality  Status                     ---------                               -----------         ------                     CBC WITH ELFY[[id number]]                Abnormal            Final result                 Please view results for these tests on the individual orders.     CT BRAIN WO IV CONTRAST   Final Result by Edi, Radresults In (04/05 1650)   NO ACUTE FINDINGS         One or more dose reduction techniques were used (e.g., Automated exposure control, adjustment of the mA and/or kV according to patient size, use of iterative reconstruction technique).         Radiologist location ID: [place] Decision Making   MDM diff dx of  Mass, bleeding. Hypertension. Anxiety, panic attack, anemia.  Dehydration. Vertigo.     ED Course as of 01/05/22 2216   Wed Jan 05, 2022   1604 Ct has not been done.          Medications Administered in the ED   LORazepam (ATIVAN) tablet  (1 mg Oral Given 01/05/22 1548)   meclizine (ANTIVERT) tablet (25 mg Oral Given 01/05/22 1548)   ketorolac (TORADOL) 82m/2 mL IM injection (60 mg IntraMUSCULAR Given 01/05/22 1639)     Clinical Impression   Headache (Primary)   Dizziness   Panic disorder       Disposition: Discharged

## 2022-01-17 ENCOUNTER — Other Ambulatory Visit: Payer: BC Managed Care – PPO | Attending: FAMILY PRACTICE

## 2022-01-17 ENCOUNTER — Other Ambulatory Visit: Payer: Self-pay

## 2022-01-17 DIAGNOSIS — M353 Polymyalgia rheumatica: Secondary | ICD-10-CM

## 2022-01-24 LAB — HEP-2 SUBSTRATE ANTINUCLEAR ANTIBODIES (ANA), SERUM: ANA INTERPRETATION: NEGATIVE

## 2022-02-16 ENCOUNTER — Ambulatory Visit (HOSPITAL_PSYCHIATRIC): Payer: Self-pay | Admitting: Family

## 2022-02-17 ENCOUNTER — Ambulatory Visit (HOSPITAL_PSYCHIATRIC): Payer: Self-pay | Admitting: Family

## 2022-02-25 ENCOUNTER — Ambulatory Visit (HOSPITAL_PSYCHIATRIC): Payer: Self-pay | Admitting: Family

## 2022-03-02 ENCOUNTER — Other Ambulatory Visit: Payer: BC Managed Care – PPO | Attending: FAMILY PRACTICE

## 2022-03-02 ENCOUNTER — Other Ambulatory Visit: Payer: Self-pay

## 2022-03-02 DIAGNOSIS — F419 Anxiety disorder, unspecified: Secondary | ICD-10-CM

## 2022-03-02 DIAGNOSIS — R000 Tachycardia, unspecified: Secondary | ICD-10-CM

## 2022-03-13 LAB — VANILLYLMANDELIC ACID (VMA), RANDOM, URINE
CREATININE, RANDOM URINE: 156 mg/dL (ref 20–275)
VMA/G CREATININE: 2.9 mg/g{creat} (ref 1.1–4.1)

## 2022-03-18 ENCOUNTER — Encounter (HOSPITAL_PSYCHIATRIC): Payer: Self-pay | Admitting: Family

## 2022-03-18 DIAGNOSIS — E559 Vitamin D deficiency, unspecified: Secondary | ICD-10-CM | POA: Insufficient documentation

## 2022-03-18 DIAGNOSIS — K219 Gastro-esophageal reflux disease without esophagitis: Secondary | ICD-10-CM | POA: Insufficient documentation

## 2022-03-18 DIAGNOSIS — F411 Generalized anxiety disorder: Secondary | ICD-10-CM | POA: Insufficient documentation

## 2022-03-18 DIAGNOSIS — E538 Deficiency of other specified B group vitamins: Secondary | ICD-10-CM | POA: Insufficient documentation

## 2022-03-23 ENCOUNTER — Ambulatory Visit (HOSPITAL_PSYCHIATRIC): Payer: Self-pay | Admitting: Family

## 2022-03-23 ENCOUNTER — Ambulatory Visit (HOSPITAL_PSYCHIATRIC): Payer: BC Managed Care – PPO | Admitting: Family

## 2022-03-23 DIAGNOSIS — F411 Generalized anxiety disorder: Secondary | ICD-10-CM

## 2022-03-23 DIAGNOSIS — E538 Deficiency of other specified B group vitamins: Secondary | ICD-10-CM

## 2022-03-23 DIAGNOSIS — E559 Vitamin D deficiency, unspecified: Secondary | ICD-10-CM

## 2022-03-23 DIAGNOSIS — K219 Gastro-esophageal reflux disease without esophagitis: Secondary | ICD-10-CM

## 2022-07-19 ENCOUNTER — Other Ambulatory Visit: Payer: BC Managed Care – PPO | Attending: FAMILY PRACTICE

## 2022-07-19 ENCOUNTER — Other Ambulatory Visit: Payer: Self-pay

## 2022-07-19 DIAGNOSIS — F411 Generalized anxiety disorder: Secondary | ICD-10-CM | POA: Insufficient documentation

## 2022-07-19 DIAGNOSIS — R42 Dizziness and giddiness: Secondary | ICD-10-CM | POA: Insufficient documentation

## 2022-07-19 DIAGNOSIS — K219 Gastro-esophageal reflux disease without esophagitis: Secondary | ICD-10-CM | POA: Insufficient documentation

## 2022-07-19 DIAGNOSIS — Z79899 Other long term (current) drug therapy: Secondary | ICD-10-CM | POA: Insufficient documentation

## 2022-07-19 DIAGNOSIS — R790 Abnormal level of blood mineral: Secondary | ICD-10-CM | POA: Insufficient documentation

## 2022-07-19 DIAGNOSIS — R519 Headache, unspecified: Secondary | ICD-10-CM | POA: Insufficient documentation

## 2022-07-19 DIAGNOSIS — R7303 Prediabetes: Secondary | ICD-10-CM | POA: Insufficient documentation

## 2022-07-19 LAB — CBC WITH DIFF
BASOPHIL #: 0.1 10*3/uL (ref 0.00–0.10)
BASOPHIL %: 1 % (ref 0–1)
EOSINOPHIL #: 0.1 10*3/uL (ref 0.00–0.50)
EOSINOPHIL %: 2 %
HCT: 43.8 % — ABNORMAL HIGH (ref 31.2–41.9)
HGB: 15 g/dL — ABNORMAL HIGH (ref 10.9–14.3)
LYMPHOCYTE #: 2.6 10*3/uL (ref 1.00–3.00)
LYMPHOCYTE %: 32 % (ref 16–44)
MCH: 29.9 pg (ref 24.7–32.8)
MCHC: 34.2 g/dL (ref 32.3–35.6)
MCV: 87.4 fL (ref 75.5–95.3)
MONOCYTE #: 0.6 10*3/uL (ref 0.30–1.00)
MONOCYTE %: 7 % (ref 5–13)
MPV: 10.7 fL (ref 7.9–10.8)
NEUTROPHIL #: 4.8 10*3/uL (ref 1.85–7.80)
NEUTROPHIL %: 58 % (ref 43–77)
PLATELETS: 236 10*3/uL (ref 140–440)
RBC: 5.01 10*6/uL — ABNORMAL HIGH (ref 3.63–4.92)
RDW: 13.4 % (ref 12.3–17.7)
WBC: 8.2 10*3/uL (ref 3.8–11.8)

## 2022-07-19 LAB — IRON TRANSFERRIN AND TIBC
IRON (TRANSFERRIN) SATURATION: 19 % (ref 15–50)
IRON: 69 ug/dL (ref 50–212)
TOTAL IRON BINDING CAPACITY: 361 ug/dL (ref 250–450)
TRANSFERRIN: 258 mg/dL (ref 203–362)
UIBC: 292 ug/dL (ref 130–375)

## 2022-07-19 LAB — URINE DRUG SCREEN
AMPHET QL: NEGATIVE
BARB QL: NEGATIVE
BENZO QL: NEGATIVE
BUP QL: NEGATIVE
CANNAQL: NEGATIVE
COCQL: NEGATIVE
FENTANYL, RANDOM URINE: NEGATIVE
OPIATE: NEGATIVE
OXYCODONE URINE: NEGATIVE
PCP QL: NEGATIVE

## 2022-07-19 LAB — COMPREHENSIVE METABOLIC PANEL, NON-FASTING
ALBUMIN/GLOBULIN RATIO: 1.4 (ref 0.8–1.4)
ALBUMIN: 4.6 g/dL (ref 3.5–5.7)
ALKALINE PHOSPHATASE: 80 U/L (ref 34–104)
ALT (SGPT): 15 U/L (ref 7–52)
ANION GAP: 7 mmol/L (ref 4–13)
AST (SGOT): 12 U/L — ABNORMAL LOW (ref 13–39)
BILIRUBIN TOTAL: 0.3 mg/dL (ref 0.3–1.2)
BUN/CREA RATIO: 15 (ref 6–22)
BUN: 13 mg/dL (ref 7–25)
CALCIUM, CORRECTED: 8.9 mg/dL (ref 8.9–10.8)
CALCIUM: 9.5 mg/dL (ref 8.6–10.3)
CHLORIDE: 104 mmol/L (ref 98–107)
CO2 TOTAL: 27 mmol/L (ref 21–31)
CREATININE: 0.88 mg/dL (ref 0.60–1.30)
ESTIMATED GFR: 83 mL/min/{1.73_m2} (ref >59)
GLOBULIN: 3.3 (ref 2.9–5.4)
GLUCOSE: 86 mg/dL (ref 74–109)
OSMOLALITY, CALCULATED: 275 mOsm/kg (ref 270–290)
POTASSIUM: 4.2 mmol/L (ref 3.5–5.1)
PROTEIN TOTAL: 7.9 g/dL (ref 6.4–8.9)
SODIUM: 138 mmol/L (ref 136–145)

## 2022-07-19 LAB — HGA1C (HEMOGLOBIN A1C WITH EST AVG GLUCOSE): HEMOGLOBIN A1C: 5.9 % (ref 4.0–6.0)

## 2022-07-19 LAB — MAGNESIUM: MAGNESIUM: 2.1 mg/dL (ref 1.9–2.7)

## 2022-07-21 LAB — METHADONE, RANDOM URINE: METHADONE URINE: NEGATIVE

## 2022-08-05 ENCOUNTER — Encounter (HOSPITAL_PSYCHIATRIC): Payer: Self-pay | Admitting: Family

## 2022-08-05 ENCOUNTER — Ambulatory Visit (HOSPITAL_PSYCHIATRIC): Payer: Self-pay | Admitting: Family

## 2022-08-05 ENCOUNTER — Other Ambulatory Visit: Payer: Self-pay

## 2022-08-05 ENCOUNTER — Ambulatory Visit: Payer: BC Managed Care – PPO | Attending: Family | Admitting: Family

## 2022-08-05 VITALS — BP 133/74 | HR 90 | Resp 18 | Ht 63.0 in | Wt 170.0 lb

## 2022-08-05 DIAGNOSIS — F4310 Post-traumatic stress disorder, unspecified: Secondary | ICD-10-CM | POA: Insufficient documentation

## 2022-08-05 DIAGNOSIS — F411 Generalized anxiety disorder: Secondary | ICD-10-CM

## 2022-08-05 DIAGNOSIS — F410 Panic disorder [episodic paroxysmal anxiety] without agoraphobia: Secondary | ICD-10-CM | POA: Insufficient documentation

## 2022-08-05 DIAGNOSIS — F331 Major depressive disorder, recurrent, moderate: Secondary | ICD-10-CM

## 2022-08-05 MED ORDER — SERTRALINE 50 MG TABLET
50.0000 mg | ORAL_TABLET | Freq: Every day | ORAL | 0 refills | Status: DC
Start: 2022-08-05 — End: 2022-09-02

## 2022-08-05 MED ORDER — LORAZEPAM 0.5 MG TABLET
0.5000 mg | ORAL_TABLET | Freq: Every day | ORAL | 0 refills | Status: DC | PRN
Start: 2022-08-05 — End: 2022-08-08
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
## 2022-08-05 NOTE — Patient Instructions (Signed)
Continue Lamictal 100 mg twice a day as a mood stabilizer. Mood stabilizer: Trileptal Lamictal: If you develop any new rashes or sore places in your mouth please stop medication and seek medical advise. If you have worsening fatigue, fever, sore throat and rash go to the ER. Patient verbalizes understanding.     Sertraline (Zoloft) 25 mg a day for a week then 50 mg a day, (we can go up to as high as 200 mg a day.)  Continue the inderal : this can actually help with anxiety.   Ativan 0.5 mg daily if needed for anxiety, but not every day.   Recommend counseling: given pamphlet for [facility].   Avoid alcohol when taking ativan, may cause respiratory suppression. Avoid other illicit substances.

## 2022-08-08 ENCOUNTER — Other Ambulatory Visit (HOSPITAL_PSYCHIATRIC): Payer: Self-pay | Admitting: Family

## 2022-08-08 MED ORDER — LORAZEPAM 0.5 MG TABLET
0.5000 mg | ORAL_TABLET | Freq: Every day | ORAL | 0 refills | Status: DC | PRN
Start: 2022-08-08 — End: 2022-09-02

## 2022-08-08 NOTE — Telephone Encounter (Signed)
Patient called and was unable to get her Lorazepam on Friday because [facility] does not have Lorazepam. Patient called [facility] and they do have the medication. Would you mind to refill the medication there. I built a new prescription. Thanks!

## 2022-08-08 NOTE — Telephone Encounter (Signed)
RX approved and encounter closed

## 2022-09-02 ENCOUNTER — Encounter (HOSPITAL_PSYCHIATRIC): Payer: Self-pay | Admitting: Family

## 2022-09-02 ENCOUNTER — Other Ambulatory Visit: Payer: Self-pay

## 2022-09-02 ENCOUNTER — Ambulatory Visit: Payer: BC Managed Care – PPO | Attending: Family | Admitting: Family

## 2022-09-02 VITALS — BP 126/76 | HR 82 | Resp 18 | Ht 63.0 in | Wt 180.0 lb

## 2022-09-02 DIAGNOSIS — F4310 Post-traumatic stress disorder, unspecified: Secondary | ICD-10-CM | POA: Insufficient documentation

## 2022-09-02 DIAGNOSIS — F410 Panic disorder [episodic paroxysmal anxiety] without agoraphobia: Secondary | ICD-10-CM | POA: Insufficient documentation

## 2022-09-02 DIAGNOSIS — F331 Major depressive disorder, recurrent, moderate: Secondary | ICD-10-CM | POA: Insufficient documentation

## 2022-09-02 DIAGNOSIS — Z79899 Other long term (current) drug therapy: Secondary | ICD-10-CM | POA: Insufficient documentation

## 2022-09-02 DIAGNOSIS — F411 Generalized anxiety disorder: Secondary | ICD-10-CM | POA: Insufficient documentation

## 2022-09-02 MED ORDER — SERTRALINE 100 MG TABLET
100.0000 mg | ORAL_TABLET | Freq: Every day | ORAL | 0 refills | Status: DC
Start: 2022-09-02 — End: 2022-12-02

## 2022-09-02 MED ORDER — LORAZEPAM 0.5 MG TABLET
0.5000 mg | ORAL_TABLET | Freq: Two times a day (BID) | ORAL | 2 refills | Status: AC | PRN
Start: 2022-09-02 — End: 2022-10-02

## 2022-09-02 MED ORDER — LAMOTRIGINE 100 MG TABLET
100.0000 mg | ORAL_TABLET | Freq: Two times a day (BID) | ORAL | 0 refills | Status: DC
Start: 2022-09-02 — End: 2022-12-02

## 2022-09-02 NOTE — Progress Notes (Signed)
[facility]  BEHAVIORAL MEDICINE, [facility] [place]  Operated by [facility]  Progress Note    Name: Latoya Harrison MRN:  [id number]   Date: 09/02/2022 Age: 46 y.o.       Chief Complaint: Generalized Anxiety and Panic Disorder    Subjective:   Here for medication follow up. Stopped Buspar, "wasn't effective." Still with panic and anxiety. Thanksgiving was difficult, and took ativan 2 within an hour. Hosted family and it was chaos.   Issues with sleep. Past 10 days have been so restless, toss and turn like crazy. Thinking about everything.   Inner ear episodes last over a week. I get agitated because I can't do anything or go anywhere.   Get upset and go into panic, heart starts pounding, anxiety about health.   Did go to the grocery store with daughter to buy stuff for Thanksgiving and did well.   Denies Si/HI/AVH  Christmas will be at other family members  Sometimes has crazy nightmares, but not a recurrent same same.   Willing to increase zoloft  Objective :  BP 126/76 (Site: Left, Patient Position: Sitting)   Pulse 82   Resp 18   Ht 1.6 m (5\' 3" )   Wt 81.6 kg (180 lb)   BMI 31.89 kg/m     C/o palpitations and chest pain with anxiety, denies syncope. Denies new skin rashes.     Alert and oriented x4.  Casual dress, calm, well groomed.  No SI /HI/AVH, delusions, or paranoia. Thoughts are logical, coherent, goal directed.  Good eye contact. Speech is normal rate and tone.  Mood is ``ok affect congruent.  No psychomotor agitation or psychomotor retardation, no cogwheel rigidity or abnormal movements.  Gait is normal.  Attention is good.  Concentration and memory good.  No cognitive deficits noted.  Judgment fair, insight fair.  Calculation and abstraction are within normal limits.      PDMP: Ativan filled 08/08/2022  PHQ9: 10  Data reviewed:    Current Outpatient Medications   Medication Sig    famotidine (PEPCID) 40 mg Oral Tablet Take 1 Tablet (40 mg total)  by mouth Once a day    lamoTRIgine (LAMICTAL) 100 mg Oral Tablet Take 1 Tablet (100 mg total) by mouth Twice daily    LORazepam (ATIVAN) 0.5 mg Oral Tablet Take 1 Tablet (0.5 mg total) by mouth Once per day as needed for Anxiety for up to 30 days Indications: anxious    magnesium oxide (MAG-OX) 400 mg Oral Tablet Take 1 Tablet (400 mg total) by mouth Once a day    meclizine (ANTIVERT) 25 mg Oral Tablet Take 1 Tablet (25 mg total) by mouth Every 8 hours as needed    omeprazole (PRILOSEC) 40 mg Oral Capsule, Delayed Release(E.C.) Take 1 Capsule (40 mg total) by mouth Once a day    propranoloL (INDERAL) 80 mg Oral Tablet Take 1 Tablet (80 mg total) by mouth Once a day    sertraline (ZOLOFT) 50 mg Oral Tablet Take 1 Tablet (50 mg total) by mouth Once a day for 90 days 1/2 tab a day for a week then 1 tab a day Indications: anxiousness associated with depression     Assessment/Plan  Problem List Items Addressed This Visit          Psychiatric    GAD (generalized anxiety disorder)    Panic attacks     Other Visit Diagnoses       Moderate episode  of recurrent major depressive disorder (CMS HCC)    -  Primary    PTSD (post-traumatic stress disorder)              Moderate level of medical decision making, discussion of multiple diagnosis and symptoms, review of PHQ-9, review of symptoms, patient education, discussion of prescribed medications and potential side effects, discussion of psychosocial stressors, and review of prescription monitoring program information.   Increase Zoloft 100 mg a day for depression and anxiety.   Continue lamictal 100 mg twice a day  Increase Ativan 0.5 mg up to twice a day for anxiety.  If mood worsens or does not improve let me know. No more than 2 ativan a day, less is better. If develop new skin rashes or thoughts of self harm let me know as well.     Lilia Pro, PMHNP-BC  09/02/2022 11:59

## 2022-09-02 NOTE — Patient Instructions (Signed)
Increase Zoloft 100 mg a day for depression and anxiety.   Continue lamictal 100 mg twice a day  Increase Ativan 0.5 mg up to twice a day for anxiety.  If mood worsens or does not improve let me know. No more than 2 ativan a day, less is better. If develop new skin rashes or thoughts of self harm let me know as well.

## 2022-10-08 ENCOUNTER — Emergency Department
Admission: EM | Admit: 2022-10-08 | Discharge: 2022-10-08 | Disposition: A | Discharge status: Home / Self Care | Payer: BC Managed Care – PPO | Attending: Family | Admitting: Family

## 2022-10-08 ENCOUNTER — Other Ambulatory Visit: Payer: Self-pay

## 2022-10-08 ENCOUNTER — Emergency Department (HOSPITAL_BASED_OUTPATIENT_CLINIC_OR_DEPARTMENT_OTHER): Payer: BC Managed Care – PPO

## 2022-10-08 ENCOUNTER — Encounter (HOSPITAL_BASED_OUTPATIENT_CLINIC_OR_DEPARTMENT_OTHER): Payer: Self-pay | Admitting: Family

## 2022-10-08 DIAGNOSIS — R0781 Pleurodynia: Secondary | ICD-10-CM | POA: Insufficient documentation

## 2022-10-08 MED ORDER — NAPROXEN 500 MG TABLET
500.0000 mg | ORAL_TABLET | Freq: Two times a day (BID) | ORAL | 0 refills | Status: DC
Start: 2022-10-08 — End: 2022-12-02

## 2022-10-08 MED ORDER — KETOROLAC 30 MG/ML (1 ML) INJECTION SOLUTION
30.0000 mg | INTRAMUSCULAR | Status: AC
Start: 2022-10-08 — End: 2022-10-08
  Administered 2022-10-08: 30 mg via INTRAMUSCULAR

## 2022-10-08 MED ORDER — LIDOCAINE 4 % TOPICAL PATCH
1.0000 | MEDICATED_PATCH | Freq: Every day | CUTANEOUS | 0 refills | Status: AC
Start: 2022-10-08 — End: 2022-10-15

## 2022-10-08 MED ORDER — KETOROLAC 30 MG/ML (1 ML) INJECTION SOLUTION
INTRAMUSCULAR | Status: AC
Start: 2022-10-08 — End: 2022-10-08
  Filled 2022-10-08: qty 1

## 2022-10-08 MED ORDER — CYCLOBENZAPRINE 10 MG TABLET
10.0000 mg | ORAL_TABLET | Freq: Three times a day (TID) | ORAL | 0 refills | Status: DC | PRN
Start: 2022-10-08 — End: 2022-12-02

## 2022-10-08 NOTE — ED Provider Notes (Signed)
[facility], [facility] Emergency Department  ED Primary Provider Note  History of Present Illness   Chief Complaint   Patient presents with    Rib Pain     Latoya Harrison is a 47 y.o. female who had concerns including Rib Pain.  Arrival: The patient arrived by Car    Patient 47 year old female to the emergency department complaining of left rib pain.  Patient states he has been ongoing for a week and has a sharp pain in her left ribs.  Patient states she felt pain in the area after reaching up towards top shelf at her house.  Patient denies any trauma or fall onto ribs.  Patient states it hurts worse with inhalation and exhalation.  Patient denies any cough, hemoptysis, fever.  Patient states pain is reproducible with palpation to left rib area.  Patient denies taking any medication currently for pain.      History Reviewed This Encounter: Medical History  Surgical History  Family History  Social History    Physical Exam   ED Triage Vitals [10/08/22 1354]   BP (Non-Invasive) 112/67   Heart Rate 91   Respiratory Rate 18   Temperature 36.6 C (97.9 F)   SpO2 97 %   Weight 77.1 kg (170 lb)   Height 1.6 m (5\' 3" )     Physical Exam  Vitals and nursing note reviewed.   Constitutional:       General: She is not in acute distress.     Appearance: She is well-developed.   HENT:      Head: Normocephalic and atraumatic.   Eyes:      Conjunctiva/sclera: Conjunctivae normal.   Cardiovascular:      Rate and Rhythm: Normal rate and regular rhythm.      Heart sounds: No murmur heard.  Pulmonary:      Effort: Pulmonary effort is normal. No respiratory distress.      Breath sounds: Normal breath sounds.   Abdominal:      Palpations: Abdomen is soft.      Tenderness: There is no abdominal tenderness.   Musculoskeletal:         General: No swelling.      Cervical back: Neck supple.   Skin:     General: Skin is warm and dry.      Capillary Refill: Capillary refill takes less than 2 seconds.    Neurological:      Mental Status: She is alert.   Psychiatric:         Mood and Affect: Mood normal.       Patient Data   Labs Ordered/Reviewed - No data to display  CT CHEST WO IV CONTRAST   Final Result by Edi, Radresults In (01/06 1515)   NO ACUTE FINDINGS         One or more dose reduction techniques were used (e.g., Automated exposure control, adjustment of the mA and/or kV according to patient size, use of iterative reconstruction technique).         Radiologist location ID: [id number]         XR RIBS LEFT   Final Result by Edi, Radresults In (01/06 1430)   NO EVIDENCE OF ACUTE RIB FRACTURE OR PNEUMOTHORAX.               Radiologist location ID: [facility]           Medical Decision Making        Medical Decision Making  Patient 47 year old female to the emergency department complaining of left rib pain.  Differential diagnosis include but are not limited to rib fracture versus contusion versus pulled muscle.  On physical examination mild swelling noted over left rib area without any obvious deformity or ecchymosis.  Lung fields clear bilaterally.  Chest x-ray ordered for evaluation with no abnormalities noted.  Chest CT ordered to confirm with no acute abnormalities as well.  Patient given Toradol IM with some relief.  Patient discharged with muscle strain/rib pain and placed on Flexeril, naproxen and lidocaine patch to area.  Patient to use deep breathing exercises to prevent any pneumonia and to follow up with primary care provider.  Patient to return to ED if any fever, cough, hemoptysis or worsening of symptoms otherwise.    Amount and/or Complexity of Data Reviewed  Radiology: ordered.    Risk  OTC drugs.  Prescription drug management.             Medications Administered in the ED   ketorolac (TORADOL) 30 mg/mL injection (30 mg IntraMUSCULAR Given 10/08/22 1425)     Clinical Impression   Rib pain (Primary)       Disposition: Discharged

## 2022-10-08 NOTE — ED Triage Notes (Signed)
Left side ib pain  can't lay on that side   increased pain when laughing coughing or moving my lt arm  started  about a week ago

## 2022-11-01 ENCOUNTER — Other Ambulatory Visit: Payer: Self-pay

## 2022-11-01 ENCOUNTER — Other Ambulatory Visit: Payer: BC Managed Care – PPO | Attending: FAMILY PRACTICE

## 2022-11-01 DIAGNOSIS — R7303 Prediabetes: Secondary | ICD-10-CM | POA: Insufficient documentation

## 2022-11-01 DIAGNOSIS — R790 Abnormal level of blood mineral: Secondary | ICD-10-CM | POA: Insufficient documentation

## 2022-11-01 DIAGNOSIS — R42 Dizziness and giddiness: Secondary | ICD-10-CM | POA: Insufficient documentation

## 2022-11-01 DIAGNOSIS — E559 Vitamin D deficiency, unspecified: Secondary | ICD-10-CM | POA: Insufficient documentation

## 2022-11-01 DIAGNOSIS — R519 Headache, unspecified: Secondary | ICD-10-CM | POA: Insufficient documentation

## 2022-11-01 DIAGNOSIS — K219 Gastro-esophageal reflux disease without esophagitis: Secondary | ICD-10-CM | POA: Insufficient documentation

## 2022-11-01 DIAGNOSIS — F411 Generalized anxiety disorder: Secondary | ICD-10-CM | POA: Insufficient documentation

## 2022-11-01 DIAGNOSIS — E611 Iron deficiency: Secondary | ICD-10-CM | POA: Insufficient documentation

## 2022-11-01 LAB — COMPREHENSIVE METABOLIC PNL, FASTING
ALBUMIN/GLOBULIN RATIO: 1.3 (ref 0.8–1.4)
ALBUMIN: 4.5 g/dL (ref 3.5–5.7)
ALKALINE PHOSPHATASE: 85 U/L (ref 34–104)
ALT (SGPT): 23 U/L (ref 7–52)
ANION GAP: 6 mmol/L (ref 4–13)
AST (SGOT): 17 U/L (ref 13–39)
BILIRUBIN TOTAL: 0.5 mg/dL (ref 0.3–1.2)
BUN/CREA RATIO: 12 (ref 6–22)
BUN: 11 mg/dL (ref 7–25)
CALCIUM, CORRECTED: 9 mg/dL (ref 8.9–10.8)
CALCIUM: 9.4 mg/dL (ref 8.6–10.3)
CHLORIDE: 106 mmol/L (ref 98–107)
CO2 TOTAL: 24 mmol/L (ref 21–31)
CREATININE: 0.92 mg/dL (ref 0.60–1.30)
ESTIMATED GFR: 79 mL/min/{1.73_m2} (ref >59)
GLOBULIN: 3.4 (ref 2.9–5.4)
GLUCOSE: 97 mg/dL (ref 74–109)
OSMOLALITY, CALCULATED: 271 mOsm/kg (ref 270–290)
POTASSIUM: 4.3 mmol/L (ref 3.5–5.1)
PROTEIN TOTAL: 7.9 g/dL (ref 6.4–8.9)
SODIUM: 136 mmol/L (ref 136–145)

## 2022-11-01 LAB — CBC WITH DIFF
BASOPHIL #: 0.1 10*3/uL (ref 0.00–0.10)
BASOPHIL %: 1 % (ref 0–1)
EOSINOPHIL #: 0.2 10*3/uL (ref 0.00–0.50)
EOSINOPHIL %: 2 %
HCT: 44.4 % — ABNORMAL HIGH (ref 31.2–41.9)
HGB: 15.4 g/dL — ABNORMAL HIGH (ref 10.9–14.3)
LYMPHOCYTE #: 2 10*3/uL (ref 1.00–3.00)
LYMPHOCYTE %: 23 % (ref 16–44)
MCH: 30.5 pg (ref 24.7–32.8)
MCHC: 34.7 g/dL (ref 32.3–35.6)
MCV: 87.9 fL (ref 75.5–95.3)
MONOCYTE #: 0.6 10*3/uL (ref 0.30–1.00)
MONOCYTE %: 7 % (ref 5–13)
MPV: 10.5 fL (ref 7.9–10.8)
NEUTROPHIL #: 6.1 10*3/uL (ref 1.85–7.80)
NEUTROPHIL %: 68 % (ref 43–77)
PLATELETS: 236 10*3/uL (ref 140–440)
RBC: 5.05 10*6/uL — ABNORMAL HIGH (ref 3.63–4.92)
RDW: 13.5 % (ref 12.3–17.7)
WBC: 9 10*3/uL (ref 3.8–11.8)

## 2022-11-01 LAB — IRON TRANSFERRIN AND TIBC
IRON (TRANSFERRIN) SATURATION: 29 % (ref 15–50)
IRON: 112 ug/dL (ref 50–212)
TOTAL IRON BINDING CAPACITY: 391 ug/dL (ref 250–450)
TRANSFERRIN: 279 mg/dL (ref 203–362)
UIBC: 279 ug/dL (ref 130–375)

## 2022-11-01 LAB — MAGNESIUM: MAGNESIUM: 2.1 mg/dL (ref 1.9–2.7)

## 2022-11-01 LAB — VITAMIN B12: VITAMIN B 12: 238 pg/mL (ref 180–914)

## 2022-11-01 LAB — VITAMIN D 25 TOTAL: VITAMIN D: 19 ng/mL — ABNORMAL LOW (ref 30–100)

## 2022-11-01 LAB — HGA1C (HEMOGLOBIN A1C WITH EST AVG GLUCOSE): HEMOGLOBIN A1C: 6 % (ref 4.0–6.0)

## 2022-12-02 ENCOUNTER — Ambulatory Visit: Payer: BC Managed Care – PPO | Attending: Family | Admitting: Family

## 2022-12-02 ENCOUNTER — Encounter (HOSPITAL_PSYCHIATRIC): Payer: Self-pay | Admitting: Family

## 2022-12-02 ENCOUNTER — Other Ambulatory Visit: Payer: Self-pay

## 2022-12-02 VITALS — BP 134/74 | HR 86 | Resp 18 | Ht 63.0 in | Wt 184.0 lb

## 2022-12-02 DIAGNOSIS — F411 Generalized anxiety disorder: Secondary | ICD-10-CM | POA: Insufficient documentation

## 2022-12-02 DIAGNOSIS — F4310 Post-traumatic stress disorder, unspecified: Secondary | ICD-10-CM | POA: Insufficient documentation

## 2022-12-02 DIAGNOSIS — F410 Panic disorder [episodic paroxysmal anxiety] without agoraphobia: Secondary | ICD-10-CM | POA: Insufficient documentation

## 2022-12-02 DIAGNOSIS — F331 Major depressive disorder, recurrent, moderate: Secondary | ICD-10-CM | POA: Insufficient documentation

## 2022-12-02 MED ORDER — SERTRALINE 100 MG TABLET
100.0000 mg | ORAL_TABLET | Freq: Every day | ORAL | 1 refills | Status: DC
Start: 2022-12-02 — End: 2023-03-06

## 2022-12-02 MED ORDER — LAMOTRIGINE 100 MG TABLET
100.0000 mg | ORAL_TABLET | Freq: Two times a day (BID) | ORAL | 0 refills | Status: DC
Start: 2022-12-02 — End: 2023-03-06

## 2022-12-02 MED ORDER — LORAZEPAM 0.5 MG TABLET
0.5000 mg | ORAL_TABLET | Freq: Two times a day (BID) | ORAL | 2 refills | Status: DC
Start: 2022-12-02 — End: 2023-03-06

## 2022-12-02 NOTE — Progress Notes (Signed)
[place], [facility] [place]  Operated by [facility]  Progress Note    Name: Latoya Harrison MRN:  [id number]   Date: 12/02/2022 Age: 47 y.o.       Chief Complaint: Generalized Anxiety and Panic Disorder    Subjective:   Here for followup. Last seen early December.   Ativan helps with sleep, helps with mind racing. Denies nightmares, reports crazy dreams.   Doing ok. Christmas was good, ativan was helpful.   Relates her mood is doing ok.   She has been doing "Spark" sometimes with her kids so that is time with them as well.   Denies SI /HI/AVH.   Plans to go to the beach for summer and [place] in October but kids are going.   When at the beach rarely feels bad at all. Enjoys the day.     Objective :  BP 134/74 (Site: Right, Patient Position: Sitting)   Pulse 86   Resp 18   Ht 1.6 m ('5\' 3"'$ )   Wt 83.5 kg (184 lb)   BMI 32.59 kg/m     Denies Chest pain, shortness of breath, recent ER visit for rib pain, resolved. Denies new skin rashes.     Alert and oriented x4.  Casual dress, calm, well groomed.  No SI /HI/AVH, delusions, or paranoia. Thoughts are logical, coherent, goal directed.  Good eye contact. Speech is normal rate and tone.  Mood is ``ok affect congruent.  No psychomotor agitation or psychomotor retardation, no cogwheel rigidity or abnormal movements.  Gait is normal.  Attention is good.  Concentration and memory good.  No cognitive deficits noted.  Judgment fair, insight fair.  Calculation and abstraction are within normal limits.      PDMP: Ativan filled regularly, appropriate  PHQ9: 14  Data reviewed:    Current Outpatient Medications   Medication Sig    ergocalciferol, vitamin D2, (DRISDOL) 1,250 mcg (50,000 unit) Oral Capsule Take 1 Capsule (50,000 Units total) by mouth Every 7 days    famotidine (PEPCID) 40 mg Oral Tablet Take 1 Tablet (40 mg total) by mouth Once a day    lamoTRIgine (LAMICTAL) 100 mg Oral Tablet Take 1  Tablet (100 mg total) by mouth Twice daily for 90 days Indications: mood disorder    LORazepam (ATIVAN) 0.5 mg Oral Tablet Take 1 Tablet (0.5 mg total) by mouth Twice daily    magnesium oxide (MAG-OX) 400 mg Oral Tablet Take 1 Tablet (400 mg total) by mouth Once a day    meclizine (ANTIVERT) 25 mg Oral Tablet Take 1 Tablet (25 mg total) by mouth Every 8 hours as needed    omeprazole (PRILOSEC) 40 mg Oral Capsule, Delayed Release(E.C.) Take 1 Capsule (40 mg total) by mouth Once a day    propranoloL (INDERAL) 80 mg Oral Tablet Take 1 Tablet (80 mg total) by mouth Once a day    sertraline (ZOLOFT) 100 mg Oral Tablet Take 1 Tablet (100 mg total) by mouth Once a day for 90 days 1/2 tab a day for a week then 1 tab a day Indications: anxiousness associated with depression     Assessment/Plan  Problem List Items Addressed This Visit          Psychiatric    GAD (generalized anxiety disorder)    Panic attacks     Other Visit Diagnoses       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   Continue current meds:   Lamictal 100 mg 2 times a day, be mindful of new skin rashes or sore places in mouth, if that happens or mood worsens seek medical attention.   Ativan 0.5 mg twice a day if needed for anxiety.   Sertraline 100 mg a day for depression and anxiety.   Expresses worry and anxiety about possibly daughter going away to school, Strongly recommend counseling. Please check with [employer].   Follow-up 3 months.  Lilia Pro, PMHNP-BC  12/02/2022 12:16

## 2022-12-02 NOTE — Patient Instructions (Addendum)
Lamictal 100 mg 2 times a day, be mindful of new skin rashes or sore places in mouth, if that happens or mood worsens seek medical attention.   Ativan 0.5 mg twice a day if needed for anxiety.   Sertraline 100 mg a day for depression and anxiety.   Follow-up 3 months.  Expresses worry and anxiety about possibly daughter going away to school, Strongly recommend counseling. Please check with [employer].

## 2022-12-13 ENCOUNTER — Ambulatory Visit (HOSPITAL_COMMUNITY)
Admission: RE | Admit: 2022-12-13 | Discharge: 2022-12-13 | Disposition: A | Discharge status: Home / Self Care | Payer: Self-pay | Source: Ambulatory Visit

## 2022-12-22 ENCOUNTER — Other Ambulatory Visit (HOSPITAL_COMMUNITY): Payer: Self-pay

## 2022-12-22 ENCOUNTER — Other Ambulatory Visit (HOSPITAL_COMMUNITY): Payer: Self-pay | Admitting: Surgery

## 2022-12-22 DIAGNOSIS — R928 Other abnormal and inconclusive findings on diagnostic imaging of breast: Secondary | ICD-10-CM

## 2023-01-03 ENCOUNTER — Other Ambulatory Visit: Payer: Self-pay

## 2023-01-03 ENCOUNTER — Inpatient Hospital Stay
Admission: RE | Admit: 2023-01-03 | Discharge: 2023-01-03 | Disposition: A | Discharge status: Home / Self Care | Payer: BC Managed Care – PPO | Source: Ambulatory Visit | Attending: Surgery | Admitting: Surgery

## 2023-01-03 DIAGNOSIS — N6022 Fibroadenosis of left breast: Secondary | ICD-10-CM | POA: Insufficient documentation

## 2023-01-03 DIAGNOSIS — R928 Other abnormal and inconclusive findings on diagnostic imaging of breast: Secondary | ICD-10-CM | POA: Insufficient documentation

## 2023-01-03 DIAGNOSIS — R920 Mammographic microcalcification found on diagnostic imaging of breast: Secondary | ICD-10-CM | POA: Insufficient documentation

## 2023-01-03 DIAGNOSIS — D242 Benign neoplasm of left breast: Secondary | ICD-10-CM | POA: Insufficient documentation

## 2023-01-03 NOTE — OR Surgeon (Signed)
OPERATIVE NOTE    Patient Name: Latoya Harrison, Latoya Harrison [facility] MRN:: [id number]  Date of Birth: 09-May-1976  Date of Service: 01/03/2023     Pre-Operative Diagnosis: * No pre-op diagnosis entered *     Post-Operative Diagnosis: * No post-op diagnosis entered *    Procedure(s)/Description:       Attending Surgeon: Rubye Oaks, MD     Anesthesia Staff:  No anesthesia staff entered.    Anesthesia Type: .* No anesthesia type entered *     Estimated Blood Loss:  Minimal    Specimens Removed:   ID Type Source Tests Collected by Time Destination   1 :  Tissue Left Breast SURGICAL PATHOLOGY SPECIMEN Rubye Oaks, MD 01/03/2023 1100       Order Name Source Comment Collection Info Order Time   SURGICAL PATHOLOGY SPECIMEN Left Breast  Collected By: Rubye Oaks, MD 01/03/2023 11:00 AM     Release to patient   Automated             Complications:  None     Condition:  Stable    Disposition:   PACU - hemodynamically stable.    Indications:  Patient is a 47 y.o. female who presents for left stereotactic biopsy due to nonpalpable mammographic abnormality which is solid by ultrasound and suspicious.    Description of Procedure/Operation:  With the patient in the prone position on the stereo table, targeting views were performed, bringing the lesion within the target window.  Appropriate targeting was accomplished.  The area was prepped, infiltrated with a small amount of lidocaine and 6 cores were taken.  A stereotactic clip localization was performed after the procedure and appropriate position was confirmed.    Rubye Oaks, MD

## 2023-01-04 DIAGNOSIS — R928 Other abnormal and inconclusive findings on diagnostic imaging of breast: Secondary | ICD-10-CM

## 2023-01-04 LAB — SURGICAL PATHOLOGY SPECIMEN

## 2023-03-06 ENCOUNTER — Encounter (HOSPITAL_PSYCHIATRIC): Payer: Self-pay | Admitting: Family

## 2023-03-06 ENCOUNTER — Other Ambulatory Visit: Payer: Self-pay

## 2023-03-06 ENCOUNTER — Ambulatory Visit: Payer: BC Managed Care – PPO | Attending: Family | Admitting: Family

## 2023-03-06 VITALS — BP 121/64 | HR 90 | Resp 18 | Ht 63.0 in | Wt 183.0 lb

## 2023-03-06 DIAGNOSIS — F331 Major depressive disorder, recurrent, moderate: Secondary | ICD-10-CM | POA: Insufficient documentation

## 2023-03-06 DIAGNOSIS — F410 Panic disorder [episodic paroxysmal anxiety] without agoraphobia: Secondary | ICD-10-CM | POA: Insufficient documentation

## 2023-03-06 DIAGNOSIS — F4310 Post-traumatic stress disorder, unspecified: Secondary | ICD-10-CM | POA: Insufficient documentation

## 2023-03-06 DIAGNOSIS — F411 Generalized anxiety disorder: Secondary | ICD-10-CM

## 2023-03-06 MED ORDER — LORAZEPAM 1 MG TABLET
1.0000 mg | ORAL_TABLET | Freq: Two times a day (BID) | ORAL | 3 refills | Status: DC | PRN
Start: 2023-03-06 — End: 2023-06-07

## 2023-03-06 MED ORDER — LAMOTRIGINE 100 MG TABLET
ORAL_TABLET | ORAL | 3 refills | Status: DC
Start: 2023-03-06 — End: 2023-06-07

## 2023-03-06 MED ORDER — SERTRALINE 100 MG TABLET
100.0000 mg | ORAL_TABLET | Freq: Every day | ORAL | 1 refills | Status: DC
Start: 2023-03-06 — End: 2023-06-07

## 2023-03-06 NOTE — Progress Notes (Signed)
BEHAVIORAL MEDICINE, [facility] [place]  [address]  [place] [address]  Operated by [facility]  Progress Note    Name: KAMBRY AIMONE MRN:  [id number]   Date: 03/06/2023 DOB:  1976/04/06 (46 y.o.)             Chief Complaint: Generalized Anxiety and Panic Attack  Subjective   Subjective:    Here for followup: recent trip to the beach and felt so good. Family reports she is irritable, on edge; moody.   Some days takes 2 ativan at a time may go days and not need. Does not feel high or groggy, feels calm.   Thinking seriously about moving to the beach if they can find a place can afford.   Reports being very functional.   Did well prepping for very busy graduation party, was not zoned out.     Initial Visit:   47 year old CF presents as a new patient. Reports having depression dating back to teen years. Treated around [age], uncertain what took. Became pregnant and stopped meds. Again same cycle before birth of 2nd child. After that, "just dealt"  Relates was doing ok with little panic attacks but I could deal with it until 2021 when had hysterectomy and death of mother in law 08-22-2020. Christmas of 2021 had a full blown panic attack and could not breath. Has recurrent thoughts regarding her death but not nightmares.   This is a difficult time of the year due to her death, increase bills, etc.   Has tried Zoloft, Lexapro, Buspar. Zoloft helped for a little bit, Buspar helped for a little bit. Taking lamictal "for dizziness." Given short term ativan recently by PCP and has taken a total of 4 tabs and that has been beneficial.   Worries about if medications are safe to take together. "I google to see what things will do to me."   Admits to being OCD about "some things." Laundry folded a certain way, dishes done a certain way. Denies hx of gambling, drug use, alcohol use. Afraid to take drugs.   Has had cardiology and neurology workup and all are ok.    Relates she often thinks about the death of her mother in law. Does not have nightmares.   Relates does not go to the grocery store, does online shopping. If in store holds spouses hand or arm.   Spouse works out of town and one day she had an episode of crying not wanting him to go to work.    Reports bad road rage.     Objective   Objective :  BP 121/64 (Site: Left, Patient Position: Sitting)   Pulse 90   Resp 18   Ht 1.6 m (5\' 3" )   Wt 83 kg (183 lb)   BMI 32.42 kg/m     Denies chest pain, palpitations, syncope, shortness of breath, new skin rashes.      Alert and oriented x4.  Casual dress, calm, well groomed.  No SI /HI/AVH, delusions, or paranoia. Thoughts are logical, coherent, goal directed.  Good eye contact. Speech is normal rate and tone.  Mood is ``ok affect congruent.  No psychomotor agitation or psychomotor retardation, no cogwheel rigidity or abnormal movements.  Gait is normal.  Attention is good.  Concentration and memory good.  No cognitive deficits noted.  Judgment fair, insight fair.  Calculation and abstraction are within normal limits. c/o painful boils, hydranitis.  PDMP: Appropriate  PHQ9: 17  Data reviewed:    Current Outpatient Medications   Medication Sig    ergocalciferol, vitamin D2, (DRISDOL) 1,250 mcg (50,000 unit) Oral Capsule Take 1 Capsule (50,000 Units total) by mouth Every 7 days    famotidine (PEPCID) 40 mg Oral Tablet Take 1 Tablet (40 mg total) by mouth Once a day    lamoTRIgine (LAMICTAL) 100 mg Oral Tablet Take 1 Tablet (100 mg total) by mouth Twice daily for 90 days Indications: mood disorder    LORazepam (ATIVAN) 0.5 mg Oral Tablet Take 1 Tablet (0.5 mg total) by mouth Twice daily for 90 days Indications: anxious    magnesium oxide (MAG-OX) 400 mg Oral Tablet Take 1 Tablet (400 mg total) by mouth Once a day    meclizine (ANTIVERT) 25 mg Oral Tablet Take 1 Tablet (25 mg total) by mouth Every 8 hours as needed    omeprazole (PRILOSEC) 40 mg Oral Capsule, Delayed  Release(E.C.) Take 1 Capsule (40 mg total) by mouth Once a day    propranoloL (INDERAL) 80 mg Oral Tablet Take 1 Tablet (80 mg total) by mouth Once a day    sertraline (ZOLOFT) 100 mg Oral Tablet Take 1 Tablet (100 mg total) by mouth Once a day for 90 days Daily Indications: anxiousness associated with depression        Assessment/Plan  Problem List Items Addressed This Visit          Psychiatric    GAD (generalized anxiety disorder)    Panic attacks     Other Visit Diagnoses       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   Increase Lamictal to 100 mg morning and 150 mg at night.  Increase ativan 1 mg may take twice a day if needed for anxiety or may take 1 tab a day.   Continue Zoloft 100 mg a day for depression.   Mood stabilizers such as Lamictal, trileptal have been associated with a severe life threatening skin condition. If you develop new skin rashes or sore places in mouth stop medication and let us know or go to ER. Trileptal can cause lower sodium levels, will need to monitor periodically. If mood worsens please let us know.     Avoid alcohol and illicit drugs. If you have issues or concerns, thoughts of hurting self or others or adverse medication reactions please call us or go to ER.  Outpatient [facility] [place]: [phone].     Follow-up 3 months.   Candie Mile, PMHNP-BC  03/06/2023 14:59

## 2023-03-06 NOTE — Patient Instructions (Signed)
Increase Lamictal to 100 mg morning and 150 mg at night.  Increase ativan 1 mg may take twice a day if needed for anxiety or may take 1 tab a day.   Continue Zoloft 100 mg a day for depression.   Mood stabilizers such as Lamictal, trileptal have been associated with a severe life threatening skin condition. If you develop new skin rashes or sore places in mouth stop medication and let us know or go to ER. Trileptal can cause lower sodium levels, will need to monitor periodically. If mood worsens please let us know.     Avoid alcohol and illicit drugs. If you have issues or concerns, thoughts of hurting self or others or adverse medication reactions please call us or go to ER.  Outpatient clinic [facility] [place] [facility] [place]: [phone].     Follow-up 3 months.

## 2023-04-17 ENCOUNTER — Other Ambulatory Visit: Payer: BC Managed Care – PPO | Attending: FAMILY PRACTICE

## 2023-04-17 ENCOUNTER — Other Ambulatory Visit: Payer: Self-pay

## 2023-04-17 DIAGNOSIS — R42 Dizziness and giddiness: Secondary | ICD-10-CM | POA: Insufficient documentation

## 2023-04-17 DIAGNOSIS — S0300XA Dislocation of jaw, unspecified side, initial encounter: Secondary | ICD-10-CM | POA: Insufficient documentation

## 2023-04-17 DIAGNOSIS — R519 Headache, unspecified: Secondary | ICD-10-CM | POA: Insufficient documentation

## 2023-04-17 DIAGNOSIS — E538 Deficiency of other specified B group vitamins: Secondary | ICD-10-CM | POA: Insufficient documentation

## 2023-04-17 DIAGNOSIS — E559 Vitamin D deficiency, unspecified: Secondary | ICD-10-CM | POA: Insufficient documentation

## 2023-04-17 LAB — HEPATIC FUNCTION PANEL
ALBUMIN/GLOBULIN RATIO: 1.4 (ref 0.8–1.4)
ALBUMIN: 4.4 g/dL (ref 3.5–5.7)
ALKALINE PHOSPHATASE: 86 U/L (ref 34–104)
ALT (SGPT): 19 U/L (ref 7–52)
AST (SGOT): 13 U/L (ref 13–39)
BILIRUBIN DIRECT: 0.04 md/dL (ref <=0.20)
BILIRUBIN TOTAL: 0.4 mg/dL (ref 0.3–1.2)
BILIRUBIN, INDIRECT: 0.36 mg/dL (ref <=1)
GLOBULIN: 3.2 (ref 2.9–5.4)
PROTEIN TOTAL: 7.6 g/dL (ref 6.4–8.9)

## 2023-04-17 LAB — BASIC METABOLIC PANEL
ANION GAP: 8 mmol/L (ref 4–13)
BUN/CREA RATIO: 12 (ref 6–22)
BUN: 10 mg/dL (ref 7–25)
CALCIUM: 9.3 mg/dL (ref 8.6–10.3)
CHLORIDE: 105 mmol/L (ref 98–107)
CO2 TOTAL: 23 mmol/L (ref 21–31)
CREATININE: 0.83 mg/dL (ref 0.60–1.30)
ESTIMATED GFR: 89 mL/min/{1.73_m2} (ref >59)
GLUCOSE: 102 mg/dL (ref 74–109)
OSMOLALITY, CALCULATED: 271 mOsm/kg (ref 270–290)
POTASSIUM: 4.1 mmol/L (ref 3.5–5.1)
SODIUM: 136 mmol/L (ref 136–145)

## 2023-04-17 LAB — CBC WITH DIFF
BASOPHIL #: 0.1 10*3/uL (ref 0.00–0.10)
BASOPHIL %: 1 % (ref 0–1)
EOSINOPHIL #: 0.1 10*3/uL (ref 0.00–0.50)
EOSINOPHIL %: 2 %
HCT: 43.4 % — ABNORMAL HIGH (ref 31.2–41.9)
HGB: 14.6 g/dL — ABNORMAL HIGH (ref 10.9–14.3)
LYMPHOCYTE #: 2.2 10*3/uL (ref 1.00–3.00)
LYMPHOCYTE %: 27 % (ref 16–44)
MCH: 29.2 pg (ref 24.7–32.8)
MCHC: 33.7 g/dL (ref 32.3–35.6)
MCV: 86.4 fL (ref 75.5–95.3)
MONOCYTE #: 0.6 10*3/uL (ref 0.30–1.00)
MONOCYTE %: 7 % (ref 5–13)
MPV: 9.6 fL (ref 7.9–10.8)
NEUTROPHIL #: 5.2 10*3/uL (ref 1.85–7.80)
NEUTROPHIL %: 63 % (ref 43–77)
PLATELETS: 224 10*3/uL (ref 140–440)
RBC: 5.02 10*6/uL — ABNORMAL HIGH (ref 3.63–4.92)
RDW: 14.7 % (ref 12.3–17.7)
WBC: 8.2 10*3/uL (ref 3.8–11.8)

## 2023-04-17 LAB — SEDIMENTATION RATE: ERYTHROCYTE SEDIMENTATION RATE (ESR): 42 mm/hr — ABNORMAL HIGH (ref <20)

## 2023-04-17 LAB — MAGNESIUM: MAGNESIUM: 1.9 mg/dL (ref 1.9–2.7)

## 2023-04-17 LAB — VITAMIN B12: VITAMIN B 12: 209 pg/mL (ref 180–914)

## 2023-04-17 LAB — VITAMIN D 25 TOTAL: VITAMIN D 25, TOTAL: 34.33 ng/mL (ref 30.00–100.00)

## 2023-04-26 ENCOUNTER — Other Ambulatory Visit (HOSPITAL_COMMUNITY): Payer: Self-pay | Admitting: FAMILY PRACTICE

## 2023-04-26 DIAGNOSIS — G8929 Other chronic pain: Secondary | ICD-10-CM

## 2023-05-24 ENCOUNTER — Other Ambulatory Visit: Payer: Self-pay

## 2023-05-24 ENCOUNTER — Encounter (HOSPITAL_BASED_OUTPATIENT_CLINIC_OR_DEPARTMENT_OTHER): Payer: Self-pay

## 2023-05-24 ENCOUNTER — Emergency Department
Admission: EM | Admit: 2023-05-24 | Discharge: 2023-05-24 | Disposition: A | Discharge status: Home / Self Care | Payer: BC Managed Care – PPO | Attending: EMERGENCY MEDICINE | Admitting: EMERGENCY MEDICINE

## 2023-05-24 DIAGNOSIS — R42 Dizziness and giddiness: Secondary | ICD-10-CM | POA: Insufficient documentation

## 2023-05-24 DIAGNOSIS — Z1152 Encounter for screening for COVID-19: Secondary | ICD-10-CM | POA: Insufficient documentation

## 2023-05-24 DIAGNOSIS — H6692 Otitis media, unspecified, left ear: Secondary | ICD-10-CM | POA: Insufficient documentation

## 2023-05-24 DIAGNOSIS — J069 Acute upper respiratory infection, unspecified: Secondary | ICD-10-CM | POA: Insufficient documentation

## 2023-05-24 LAB — COVID-19, FLU A/B, RSV RAPID BY PCR
INFLUENZA VIRUS TYPE A: NOT DETECTED
INFLUENZA VIRUS TYPE B: NOT DETECTED
RESPIRATORY SYNCTIAL VIRUS (RSV): NOT DETECTED
SARS-CoV-2: NOT DETECTED

## 2023-05-24 LAB — RAPID THROAT SCREEN, STREPTOCOCCUS, WITH REFLEX: THROAT RAPID SCREEN, STREPTOCOCCUS: NEGATIVE

## 2023-05-24 MED ORDER — MECLIZINE 25 MG TABLET
25.0000 mg | ORAL_TABLET | Freq: Three times a day (TID) | ORAL | 0 refills | Status: AC
Start: 2023-05-24 — End: 2023-06-03

## 2023-05-24 MED ORDER — CEFTRIAXONE 1 GRAM SOLUTION FOR INJECTION
INTRAMUSCULAR | Status: AC
Start: 2023-05-24 — End: 2023-05-24
  Filled 2023-05-24: qty 10

## 2023-05-24 MED ORDER — NAPROXEN 250 MG TABLET
ORAL_TABLET | ORAL | Status: AC
Start: 2023-05-24 — End: 2023-05-24
  Filled 2023-05-24: qty 2

## 2023-05-24 MED ORDER — MECLIZINE 25 MG TABLET
ORAL_TABLET | ORAL | Status: AC
Start: 2023-05-24 — End: 2023-05-24
  Filled 2023-05-24: qty 2

## 2023-05-24 MED ORDER — NAPROXEN 250 MG TABLET
500.0000 mg | ORAL_TABLET | ORAL | Status: AC
Start: 2023-05-24 — End: 2023-05-24
  Administered 2023-05-24: 500 mg via ORAL

## 2023-05-24 MED ORDER — CEFTRIAXONE 1 GRAM SOLUTION FOR INJECTION
1.0000 g | Freq: Once | INTRAMUSCULAR | Status: AC
Start: 2023-05-24 — End: 2023-05-24
  Administered 2023-05-24: 1 g via INTRAMUSCULAR

## 2023-05-24 MED ORDER — DEXAMETHASONE SODIUM PHOSPHATE (PF) 10 MG/ML INJECTION SOLUTION
INTRAMUSCULAR | Status: AC
Start: 2023-05-24 — End: 2023-05-24
  Filled 2023-05-24: qty 1

## 2023-05-24 MED ORDER — MECLIZINE 25 MG TABLET
50.0000 mg | ORAL_TABLET | ORAL | Status: AC
Start: 2023-05-24 — End: 2023-05-24
  Administered 2023-05-24: 50 mg via ORAL

## 2023-05-24 MED ORDER — CEFDINIR 300 MG CAPSULE
300.0000 mg | ORAL_CAPSULE | Freq: Two times a day (BID) | ORAL | 0 refills | Status: DC
Start: 2023-05-24 — End: 2023-06-07

## 2023-05-24 MED ORDER — DEXAMETHASONE SODIUM PHOSPHATE (PF) 10 MG/ML INJECTION SOLUTION
10.0000 mg | INTRAMUSCULAR | Status: AC
Start: 2023-05-24 — End: 2023-05-24
  Administered 2023-05-24: 10 mg via INTRAMUSCULAR

## 2023-05-24 MED ORDER — METHYLPREDNISOLONE 4 MG TABLETS IN A DOSE PACK
ORAL_TABLET | ORAL | 0 refills | Status: DC
Start: 2023-05-24 — End: 2023-06-07

## 2023-05-24 NOTE — ED Triage Notes (Signed)
Patient states started on Saturday cough, sore throat, and HA. Then 2 days later her ears started hurting. Left ear hurts more than the right ear. Hx of vertigo so due to the ears she is off balance.

## 2023-05-24 NOTE — ED Provider Notes (Signed)
[facility], [facility] Emergency Department  ED Primary Provider Note  History of Present Illness   Chief Complaint   Patient presents with    Flu Like Symptoms     Arrival: The patient arrived by Car  Latoya Harrison is a 47 y.o. female who had concerns including Flu Like Symptoms. Pt states left ear , sore throat, cough cold sx denies sob. Hx of vertigo.   Review of Systems   Constitutional: No fever, chills or weakness   Skin: No rash or diaphoresis  HENT: No headaches, + ear pain , sore throat congestion  Eyes: No vision changes or photophobia   Cardio: No chest pain, palpitations or leg swelling   Respiratory: No cough, wheezing or SOB  GI:  No nausea, vomiting or stool changes  GU:  No dysuria, hematuria, or increased frequency  MSK: No muscle aches, joint or back pain  Neuro: No seizures, LOC, numbness, tingling, or focal weakness  Psychiatric: No depression, SI or substance abuse  All other systems reviewed and are negative.    History Reviewed This Encounter: all noted and reviewed .    Physical Exam   ED Triage Vitals [05/24/23 1110]   BP (Non-Invasive) 118/74   Heart Rate 88   Respiratory Rate 18   Temperature 36.9 C (98.4 F)   SpO2 98 %   Weight 81.6 kg (180 lb)   Height 1.6 m (5\' 3" )       Constitutional:  47 y.o. female who appears in no distress. Normal color, no cyanosis.   HENT:   Head: Normocephalic and atraumatic.   Mouth/Throat: Oropharynx is red and moist. Left tm red dull. Cloudy.   Eyes: EOMI, PERRL   Neck: Trachea midline. Neck supple.  Cardiovascular: RRR, No murmurs, rubs or gallops. Intact distal pulses.  Pulmonary/Chest: BS equal bilaterally. No respiratory distress. No wheezes, rales or chest tenderness.   Abdominal: Bowel sounds present and normal. Abdomen soft, no tenderness, no rebound and no guarding.  Back: No midline spinal tenderness, no paraspinal tenderness, no CVA tenderness.           Musculoskeletal: No edema, tenderness or  deformity.  Skin: warm and dry. No rash, erythema, pallor or cyanosis  Psychiatric: normal mood and affect. Behavior is normal.   Neurological: Patient keenly alert and responsive, easily able to raise eyebrows, facial muscles/expressions symmetric, speaking in fluent sentences, moving all extremities equally and fully, normal gait  Patient Data     Labs Ordered/Reviewed   RAPID THROAT SCREEN, STREPTOCOCCUS, WITH REFLEX - Normal    Narrative:     Walk-Away Mode   COVID-19, FLU A/B, RSV RAPID BY PCR - Normal    Narrative:     Results are for the simultaneous qualitative identification of SARS-CoV-2 (formerly 2019-nCoV), Influenza A, Influenza B, and RSV RNA. These etiologic agents are generally detectable in nasopharyngeal and nasal swabs during the ACUTE PHASE of infection. Hence, this test is intended to be performed on respiratory specimens collected from individuals with signs and symptoms of upper respiratory tract infection who meet Centers for Disease Control and Prevention (CDC) clinical and/or epidemiological criteria for Coronavirus Disease 2019 (COVID-19) testing. CDC COVID-19 criteria for testing on human specimens is available at [facility] webpage information for Healthcare Professionals: Coronavirus Disease 2019 (COVID-19) ([employer]).     False-negative results may occur if the virus has genomic mutations, insertions, deletions, or rearrangements or if performed very early in the course of illness. Otherwise, negative results indicate  virus specific RNA targets are not detected, however negative results do not preclude SARS-CoV-2 infection/COVID-19, Influenza, or Respiratory syncytial virus infection. Results should not be used as the sole basis for patient management decisions. Negative results must be combined with clinical observations, patient history, and epidemiological information. If upper respiratory tract infection is still suspected based on  exposure history together with other clinical findings, re-testing should be considered.    Test methodology:   Cepheid Xpert Xpress SARS-CoV-2/Flu/RSV Assay real-time polymerase chain reaction (RT-PCR) test on the GeneXpert Dx and Xpert Xpress systems.   THROAT CULTURE, BETA HEMOLYTIC STREPTOCOCCUS     No orders to display     Medical Decision Making   Diff dx of om  uri covid flu strep.   Swabs negs no sob no distress. Some pain relief of left ear. Ref to ENT.   Medications Administered in the ED   cefTRIAXone (ROCEPHIN) 1 g in lidocaine 2.86 mL (tot vol) IM injection (1 g IntraMUSCULAR Given 05/24/23 1149)   dexAMETHasone (PF) 10 mg/mL injection (10 mg IntraMUSCULAR Given 05/24/23 1149)   meclizine (ANTIVERT) tablet (50 mg Oral Given 05/24/23 1149)   naproxen (NAPROSYN) tablet (500 mg Oral Given 05/24/23 1149)     Clinical Impression   Left otitis media (Primary)   Upper respiratory tract infection, unspecified type   Vertigo       Disposition: Discharged  I was physically present in the treatment area and immediately available for consultation during the period of this patients care. This does not imply that I physically saw and examined the patient or participated in the medical decision making at the time of service. I was not involved in the bedside history, physical, or evaluation.     Pam Drown MD

## 2023-05-24 NOTE — ED Nurses Note (Signed)
Patient discharged home with family.  AVS reviewed with patient/care giver.  A written copy of the AVS and discharge instructions was given to the patient/care giver. Scripts escribed to preferred pharmacy. Questions sufficiently answered as needed.  Patient/care giver encouraged to follow up with PCP as indicated.  In the event of an emergency, patient/care giver instructed to call 911 or go to the nearest emergency room.

## 2023-05-26 LAB — THROAT CULTURE, BETA HEMOLYTIC STREPTOCOCCUS: THROAT CULTURE: NORMAL

## 2023-06-07 ENCOUNTER — Encounter (HOSPITAL_PSYCHIATRIC): Payer: Self-pay | Admitting: Family

## 2023-06-07 ENCOUNTER — Ambulatory Visit: Payer: BC Managed Care – PPO | Attending: Family | Admitting: Family

## 2023-06-07 ENCOUNTER — Other Ambulatory Visit: Payer: Self-pay

## 2023-06-07 VITALS — BP 115/64 | HR 83 | Resp 18 | Ht 63.0 in | Wt 188.5 lb

## 2023-06-07 DIAGNOSIS — F4310 Post-traumatic stress disorder, unspecified: Secondary | ICD-10-CM | POA: Insufficient documentation

## 2023-06-07 DIAGNOSIS — F331 Major depressive disorder, recurrent, moderate: Secondary | ICD-10-CM | POA: Insufficient documentation

## 2023-06-07 DIAGNOSIS — F410 Panic disorder [episodic paroxysmal anxiety] without agoraphobia: Secondary | ICD-10-CM | POA: Insufficient documentation

## 2023-06-07 DIAGNOSIS — F3181 Bipolar II disorder: Secondary | ICD-10-CM | POA: Insufficient documentation

## 2023-06-07 DIAGNOSIS — F429 Obsessive-compulsive disorder, unspecified: Secondary | ICD-10-CM | POA: Insufficient documentation

## 2023-06-07 DIAGNOSIS — F411 Generalized anxiety disorder: Secondary | ICD-10-CM | POA: Insufficient documentation

## 2023-06-07 MED ORDER — LORAZEPAM 1 MG TABLET
1.0000 mg | ORAL_TABLET | Freq: Two times a day (BID) | ORAL | 3 refills | Status: DC | PRN
Start: 2023-06-07 — End: 2023-08-30

## 2023-06-07 MED ORDER — SERTRALINE 100 MG TABLET
100.0000 mg | ORAL_TABLET | Freq: Every day | ORAL | 1 refills | Status: DC
Start: 2023-06-07 — End: 2023-07-06

## 2023-06-07 MED ORDER — RISPERIDONE 0.5 MG TABLET
0.5000 mg | ORAL_TABLET | Freq: Every evening | ORAL | 0 refills | Status: AC
Start: 2023-06-07 — End: 2023-07-07

## 2023-06-07 MED ORDER — LAMOTRIGINE 100 MG TABLET
ORAL_TABLET | ORAL | 3 refills | Status: DC
Start: 2023-06-07 — End: 2023-08-02

## 2023-06-07 NOTE — Patient Instructions (Addendum)
dd risperidone 0.5 mg at bedtime nightly for mood, irritability, bipolar. Risperidone: May cause weight gain, abnormal breast discharge and breast development. If breast discharge please let us know.     Lamictal 100 mg take 1 1/2 tab at bedtime nightly for mood: If new skin rashes or sore places in mouth stop med and seek medical attention.   Ativan 1 mg twice a day if needed for anxiety: do not take med and drink alcohol, keep medications safe  Continue sertraline 100 mg a day for depression.   Avoid alcohol and illicit drugs. If you have issues or concerns, thoughts of hurting self or others or adverse medication reactions please call us or go to ER.  [facility] [place] [facility] [place]: [phone].       Follow-up 3 week.s

## 2023-06-07 NOTE — Progress Notes (Signed)
BEHAVIORAL MEDICINE, [facility]  [address]  [place] [address]  Operated by [facility]  Progress Note    Name: Latoya Harrison MRN:  [id number]   Date: 06/07/2023 DOB:  07/14/1976 (47 y.o.)             Chief Complaint: Major Depression  Subjective   Subjective:   Latoya Harrison is here for follow up. "Past month has been horrible." Was good at the beach. But the past month has been easily agitated with things then would need the ativan.   Has cut her own hair, hair was wet on back of her neck, so she cut scrissors. Had daughter shave neck and trim up hair. The other day cut hair again, threatened to shave head.   Hx in her lifetime of getting in car, took off left, did not tell anybody. Longest amount of time without sleep 28-30 hours "anxiety" more than once in her lifetime.     Aggravated with things in house, spur of the moment deep clean a room, get rid of things.   Has to have things done a certain way, such as folding towels.   Sometimes stares off when driving.   Difficulty falling asleep, has to have a certain pillow, lay a certain way. Sometimes "anxiety will trigger, what if I have a heart attack." Dizziness in part due to vertigo.   Does not feel recent steroids made her mood worse.   Takes ativan some days daily, some days needs two at a time "due to freaking out."   Did not need any ativan when at the beach.      Objective   Objective :  BP 115/64 (Site: Left Arm, Patient Position: Sitting)   Pulse 83   Resp 18   Ht 1.6 m (5\' 3" )   Wt 85.5 kg (188 lb 8 oz)   BMI 33.39 kg/m     Denies chest pain, c/o palpitations in the store, syncope, shortness of breath, new skin rashes.      Alert and oriented x4.  Casual dress, anxious, well groomed.  No SI /HI/AVH, delusions, or paranoia. Thoughts are logical, coherent, goal directed.  Good eye contact. Speech is rapid rate and normal tone; tangential.  Mood is ``anxious affect congruent.  No  psychomotor agitation or psychomotor retardation, no cogwheel rigidity or abnormal movements.  Gait is normal.  Attention is good.  Concentration and memory good.  No cognitive deficits noted.  Judgment fair, insight fair.  Calculation and abstraction are within normal limits.   PDMP: Ativan filled 8/22/204  PHQ9: 19  Data reviewed:    Current Outpatient Medications   Medication Sig    ergocalciferol, vitamin D2, (DRISDOL) 1,250 mcg (50,000 unit) Oral Capsule Take 1 Capsule (50,000 Units total) by mouth Every 7 days    famotidine (PEPCID) 40 mg Oral Tablet Take 1 Tablet (40 mg total) by mouth Once a day    lamoTRIgine (LAMICTAL) 100 mg Oral Tablet Take one tab morning and 1 1/2 tab at bedtime for mood Indications: mood disorder    LORazepam (ATIVAN) 1 mg Oral Tablet Take 1 Tablet (1 mg total) by mouth Twice per day as needed for Anxiety for up to 90 days Indications: anxious    magnesium oxide (MAG-OX) 400 mg Oral Tablet Take 1 Tablet (400 mg total) by mouth Once a day    meclizine (ANTIVERT) 25 mg Oral Tablet Take 1 Tablet (25 mg total) by mouth Every  8 hours as needed    omeprazole (PRILOSEC) 40 mg Oral Capsule, Delayed Release(E.C.) Take 1 Capsule (40 mg total) by mouth Once a day    propranoloL (INDERAL) 80 mg Oral Tablet Take 1 Tablet (80 mg total) by mouth Once a day    sertraline (ZOLOFT) 100 mg Oral Tablet Take 1 Tablet (100 mg total) by mouth Once a day for 180 days Daily Indications: anxiousness associated with depression        Assessment/Plan  Problem List Items Addressed This Visit          Psychiatric    GAD (generalized anxiety disorder)    Panic attacks     Other Visit Diagnoses       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   Add risperidone 0.5 mg at bedtime nightly for mood, irritability, bipolar. Risperidone: May cause weight gain, abnormal breast discharge and breast development. If breast discharge please let us know.     Lamictal 100 mg take 1 1/2 tab at bedtime nightly for mood: If new skin rashes or sore places in mouth stop med and seek medical attention.   Ativan 1 mg twice a day if needed for anxiety: do not take med and drink alcohol, keep medications safe  Continue sertraline 100 mg a day for depression.   Avoid alcohol and illicit drugs. If you have issues or concerns, thoughts of hurting self or others or adverse medication reactions please call us or go to ER.  Out[facility]: [phone].     Follow-up 3 week.s  Candie Mile, PMHNP-BC  06/07/2023 14:10

## 2023-07-06 ENCOUNTER — Other Ambulatory Visit: Payer: Self-pay

## 2023-07-06 ENCOUNTER — Ambulatory Visit: Payer: BC Managed Care – PPO | Attending: Family | Admitting: Family

## 2023-07-06 ENCOUNTER — Encounter (HOSPITAL_PSYCHIATRIC): Payer: Self-pay | Admitting: Family

## 2023-07-06 VITALS — BP 136/73 | HR 86 | Resp 18 | Ht 63.0 in | Wt 190.0 lb

## 2023-07-06 DIAGNOSIS — F4310 Post-traumatic stress disorder, unspecified: Secondary | ICD-10-CM | POA: Insufficient documentation

## 2023-07-06 DIAGNOSIS — F429 Obsessive-compulsive disorder, unspecified: Secondary | ICD-10-CM | POA: Insufficient documentation

## 2023-07-06 DIAGNOSIS — F411 Generalized anxiety disorder: Secondary | ICD-10-CM | POA: Insufficient documentation

## 2023-07-06 DIAGNOSIS — F410 Panic disorder [episodic paroxysmal anxiety] without agoraphobia: Secondary | ICD-10-CM | POA: Insufficient documentation

## 2023-07-06 DIAGNOSIS — F3181 Bipolar II disorder: Secondary | ICD-10-CM | POA: Insufficient documentation

## 2023-07-06 DIAGNOSIS — F331 Major depressive disorder, recurrent, moderate: Secondary | ICD-10-CM | POA: Insufficient documentation

## 2023-07-06 MED ORDER — ESCITALOPRAM 20 MG TABLET
20.0000 mg | ORAL_TABLET | Freq: Every day | ORAL | 1 refills | Status: DC
Start: 2023-07-06 — End: 2023-08-02

## 2023-07-06 MED ORDER — RISPERIDONE 1 MG TABLET
1.0000 mg | ORAL_TABLET | Freq: Every evening | ORAL | 1 refills | Status: DC
Start: 2023-07-06 — End: 2023-08-02

## 2023-07-06 NOTE — Patient Instructions (Addendum)
We respectfully request you to arrive 20 minutes prior to your scheduled appointment time. This allows our nursing staff to complete your intake information and [place] to stay on schedule. Your time is as valuable as our time.     Long discussion with patient: Not appropriate to take 2 ativan at a time, not ordered this way. You become used to it and then when you have a panic attack does not help you. You will need more and more. Long term use of the medication can lend to dementia.   Discussed healthy diet options, changes in eating habits.Recommend weight watchers or noom.   Change Zoloft to Lexapro, more weight neutral.   Increase risperidone 1 mg at bedtime: unfortunately can lend to weight gain. Risk of abnormal muscle movements, muscle stiffness, difficulty swallowing, breast discharge.   Atypical antipsychotics: Please call us or call 911 if you develop muscle tremors or weakness, fever, drooling. If thoughts of hurting yourself or others  please call 911, go to ER.    Risperidone: May cause weight gain, abnormal breast discharge and breast development. If breast discharge please let us know.    Ativan 1 mg may take up to 2 times a day, NOT 2 pills at one time.   Lamictal 150 mg a day for mood. Mood stabilizers such as Lamictal, trileptal have been associated with a severe life threatening skin condition. If you develop new skin rashes or sore places in mouth stop medication and let us know or go to ER. Trileptal can cause lower sodium levels, will need to monitor periodically. If mood worsens please let us know.     Avoid alcohol and illicit drugs. If you have issues or concerns, thoughts of hurting self or others or adverse medication reactions please call us or go to ER.  Outpatient [facility]: [phone].     Follow up 4 weeks

## 2023-07-06 NOTE — Progress Notes (Signed)
BEHAVIORAL MEDICINE, [facility] [place]  [address]  [place] [address]  Operated by [facility]  Progress Note    Name: Latoya Harrison MRN:  [id number]   Date: 07/06/2023 DOB:  1975-12-09 (47 y.o.)             Chief Complaint: Generalized Anxiety, Panic Disorder, Bipolar Disorder, and OCD  Subjective   Subjective:   Latoya Harrison is here for follow up. Family reports as "grouch" if does not take ativan. The other night was really stressed out, felt like would have a panic attack. Spouse is supportive, took two ativan that night and within 30 min was calm and fine.     Frustration regarding extended lack of electricity.     Feels risperidone beneficial but could be improved.   Difficulty sleeping as does not have background noise.   New tatoo on left arm "had thought about it," Has not cut hair again.     Admits to taking 2 ativan this morning. "What if I do that every day, I know I am going to have a good day."     Concerned about weight gain on Zoloft, "I have gained a tremendous amount of weight." Discussed food choices, admits cooks at home but "not healthy" Drinks cola. "I tried before just water."     Denies SI/HI/AVH     Objective   Objective :  BP 136/73 (Site: Left Arm, Patient Position: Sitting)   Pulse 86   Resp 18   Ht 1.6 m (5\' 3" )   Wt 86.2 kg (190 lb)   BMI 33.66 kg/m     Denies chest pain, palpitations, syncope, shortness of breath, new skin rashes.      Alert and oriented x4.  Casual dress, calm, well groomed.  No SI /HI/AVH, delusions, or paranoia. Thoughts are logical, coherent, goal directed.  Good eye contact. Speech is normal rate and tone.  Mood is ``ok affect congruent.  No psychomotor agitation or psychomotor retardation, no cogwheel rigidity or abnormal movements.  Gait is normal.  Attention is good.  Concentration and memory good.  No cognitive deficits noted.  Judgment fair, insight fair.  Calculation and abstraction are  within normal limits.   PDMP: Ativan filled 06/16/2023  PHQ9: 18  Data reviewed:    Current Outpatient Medications   Medication Sig    cyanocobalamin (VITAMIN B12) 1,000 mcg/mL Injection Solution Inject 1 mL (1,000 mcg total) under the skin Every 7 days    ergocalciferol, vitamin D2, (DRISDOL) 1,250 mcg (50,000 unit) Oral Capsule Take 1 Capsule (50,000 Units total) by mouth Every 7 days (Patient not taking: Reported on 07/06/2023)    famotidine (PEPCID) 40 mg Oral Tablet Take 1 Tablet (40 mg total) by mouth Once a day    lamoTRIgine (LAMICTAL) 100 mg Oral Tablet Take one tab morning and 1 1/2 tab at bedtime for mood Indications: mood disorder    LORazepam (ATIVAN) 1 mg Oral Tablet Take 1 Tablet (1 mg total) by mouth Twice per day as needed for Anxiety for up to 90 days Indications: anxious    magnesium oxide (MAG-OX) 400 mg Oral Tablet Take 1 Tablet (400 mg total) by mouth Once a day    meclizine (ANTIVERT) 25 mg Oral Tablet Take 1 Tablet (25 mg total) by mouth Every 8 hours as needed    omeprazole (PRILOSEC) 40 mg Oral Capsule, Delayed Release(E.C.) Take 1 Capsule (40 mg total) by mouth Once a day  propranoloL (INDERAL) 80 mg Oral Tablet Take 1 Tablet (80 mg total) by mouth Once a day    risperiDONE (RISPERDAL) 0.5 mg Oral Tablet Take 1 Tablet (0.5 mg total) by mouth Every night for 30 days Indications: mania associated with bipolar disorder    sertraline (ZOLOFT) 100 mg Oral Tablet Take 1 Tablet (100 mg total) by mouth Once a day for 180 days Daily Indications: anxiousness associated with depression        Assessment/Plan  Problem List Items Addressed This Visit          Psychiatric    GAD (generalized anxiety disorder)    Panic attacks    Bipolar II disorder (CMS HCC) - Primary    Obsessive-compulsive disorder     Other Visit Diagnoses       PTSD (post-traumatic stress disorder)        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   Long discussion with patient: Not appropriate to take 2 ativan at a time, not ordered this way. You become used to it and then when you have a panic attack does not help you. You will need more and more. Long term use of the medication can lend to dementia.   Discussed healthy diet options, changes in eating habits.Recommend weight watchers or noom.   Change Zoloft to Lexapro, more weight neutral.   Increase risperidone 1 mg at bedtime: unfortunately can lend to weight gain. Risk of abnormal muscle movements, muscle stiffness, difficulty swallowing, breast discharge.   Atypical antipsychotics: Please call us or call 911 if you develop muscle tremors or weakness, fever, drooling. If thoughts of hurting yourself or others  please call 911, go to ER.    Risperidone: May cause weight gain, abnormal breast discharge and breast development. If breast discharge please let us know.    Ativan 1 mg may take up to 2 times a day, NOT 2 pills at one time.   Lamictal 150 mg a day for mood. Mood stabilizers such as Lamictal, trileptal have been associated with a severe life threatening skin condition. If you develop new skin rashes or sore places in mouth stop medication and let us know or go to ER. Trileptal can cause lower sodium levels, will need to monitor periodically. If mood worsens please let us know.     Avoid alcohol and illicit drugs. If you have issues or concerns, thoughts of hurting self or others or adverse medication reactions please call us or go to ER.  Outpatient [facility]: [phone].     Follow up 4 weeks    Candie Mile, PMHNP-BC  07/06/2023 12:09

## 2023-07-11 ENCOUNTER — Other Ambulatory Visit (HOSPITAL_PSYCHIATRIC): Payer: Self-pay | Admitting: Family

## 2023-07-12 NOTE — Telephone Encounter (Signed)
Patient taking a different dosing now

## 2023-08-02 ENCOUNTER — Other Ambulatory Visit: Payer: Self-pay

## 2023-08-02 ENCOUNTER — Encounter (HOSPITAL_PSYCHIATRIC): Payer: Self-pay | Admitting: NURSE PRACTITIONER

## 2023-08-02 ENCOUNTER — Ambulatory Visit: Payer: BC Managed Care – PPO | Attending: NURSE PRACTITIONER | Admitting: NURSE PRACTITIONER

## 2023-08-02 VITALS — BP 112/70 | HR 81 | Resp 18 | Ht 63.0 in | Wt 196.0 lb

## 2023-08-02 DIAGNOSIS — F329 Major depressive disorder, single episode, unspecified: Secondary | ICD-10-CM

## 2023-08-02 DIAGNOSIS — F410 Panic disorder [episodic paroxysmal anxiety] without agoraphobia: Secondary | ICD-10-CM | POA: Insufficient documentation

## 2023-08-02 DIAGNOSIS — F429 Obsessive-compulsive disorder, unspecified: Secondary | ICD-10-CM | POA: Insufficient documentation

## 2023-08-02 DIAGNOSIS — F3181 Bipolar II disorder: Secondary | ICD-10-CM | POA: Insufficient documentation

## 2023-08-02 DIAGNOSIS — F331 Major depressive disorder, recurrent, moderate: Secondary | ICD-10-CM | POA: Insufficient documentation

## 2023-08-02 DIAGNOSIS — F4310 Post-traumatic stress disorder, unspecified: Secondary | ICD-10-CM | POA: Insufficient documentation

## 2023-08-02 DIAGNOSIS — F411 Generalized anxiety disorder: Secondary | ICD-10-CM | POA: Insufficient documentation

## 2023-08-02 MED ORDER — LAMOTRIGINE 100 MG TABLET
ORAL_TABLET | ORAL | 0 refills | Status: DC
Start: 2023-08-02 — End: 2023-08-30

## 2023-08-02 MED ORDER — ESCITALOPRAM 10 MG TABLET
20.0000 mg | ORAL_TABLET | Freq: Every day | ORAL | 0 refills | Status: DC
Start: 2023-08-02 — End: 2023-08-02

## 2023-08-02 MED ORDER — BUPROPION HCL 75 MG TABLET
75.0000 mg | ORAL_TABLET | Freq: Every day | ORAL | 0 refills | Status: DC
Start: 2023-08-02 — End: 2023-08-30

## 2023-08-02 MED ORDER — RISPERIDONE 0.5 MG TABLET
0.5000 mg | ORAL_TABLET | Freq: Every evening | ORAL | 0 refills | Status: DC
Start: 2023-08-02 — End: 2023-08-30

## 2023-08-02 NOTE — Addendum Note (Signed)
Addended by: Trish Fountain on: 08/02/2023 12:04 PM     Modules accepted: Orders

## 2023-08-02 NOTE — Progress Notes (Signed)
Progress Note    Patient's Full Name: Latoya Harrison   Patient's Date of Birth: 1976/01/31   Patient's Age: 47 y.o.   Patient's Legal Sex: female   Patient's MRN: [id number]     Current Date: 08/02/2023 10:17     Past Medical History:   Diagnosis Date    Acid reflux     Cervical disc disease     GAD (generalized anxiety disorder)     Panic attacks     Prediabetes     Recurrent major depression (CMS HCC)     Vitamin B 12 deficiency     Vitamin D deficiency       CC: "  I just don't like change"     History of Present Illness:Latoya Harrison 47 y.o. White female presents for medication management. Last appointment was seen by another NP. This is the first appointment with this provider. "I just don't like change.      Today client relays "I have vertigo and it really makes my mood work. I started Lexapro and I am nearly 200lbs and have never been close to this" She feels that her weight is not well managed and that the SSRI's are causing weight gain.    Denies suicidal, homicidal and self-harm thoughts or intentions. [name]  denies auditory, visual, tactile hallucinations, no delusions, no paranoia.  Reports sleep as good, appetite adequate and mood as stable. Reports relationship with food as normal and healthy. We discussed the importance of activity, nutrition, and proper sleep for medication to work effectively. Denies adverse reactions to medications. Verbal consent to treat given. Reviewed treatment plan with client/guardian.  Denies questions or further concerns at this time.  Advised to contact the [facility] as needed for appointments, concerns, questions, or medication refills All parties expressed understanding and agreement with this plan. Follow up in months.     Vitals:    08/02/23 1001   BP: 112/70   Pulse: 81   Resp: 18   Weight: 88.9 kg (196 lb)   Height: 1.6 m (5\' 3" )   BMI: 34.79     Client is alert and oriented x4, casually dressed, good eye contact, well groomed, appearing  stated age.  Speech is normal rate and tone.  Client is talkative and personable. There is no flight of ideas, loosening of associations, or tangential speech.  Not manic.  Mood is euthymic with no complaints.  Affect congruent. Client does not appear to be in any acute physical distress.  No auditory or visual hallucinations,  No signs of psychosis.  No plans to harm self or others.  Client is not aggressive or threatening.  No psychomotor agitation.  No psychomotor retardation.  No abnormal involuntary movements. Thoughts are linear, logical, and goal directed.  Intellectual functioning is good.  Memory is intact to recent, remote, and past events.  Patient can recall 3 of 3 objects at 0 and 5 minutes, and what was eaten for last meal.  Patient is able to provide details of current situation.  Patient can name the president, vice president, and governor.  Language is good.  Vocabulary is unimpaired, no word finding difficulty or word misuse.  Intelligence is good, patient can interpret a proverb, and reports apple and orange similarity.  Calculation is unimpaired.  Concentration is good, able to recite days of week, and spell the word world backward.  Insight is fair; patient is aware of their illness, how it affects their functioning, and what needs  to happen for future improvement.  Judgment is fair; patient is compliant with treatment and can relate appropriately to what they would do if smelling smoke in a theater or finding stamped addressed envelope.    During this visit we completed the PHQ9 (score 17); GAD7(score 18); Post Traumatic Stress Disorder (5 ); MMSE (score  30); Mood Disorder Questionnaire ( Bipolar II); AIMS (0 )      Patient Active Problem List   Diagnosis    Acid reflux    GAD (generalized anxiety disorder)    Vitamin D deficiency    Vitamin B 12 deficiency    Panic attacks    Bipolar II disorder (CMS HCC)    Obsessive-compulsive disorder     Current Outpatient Medications   Medication Sig     cyanocobalamin (VITAMIN B12) 1,000 mcg/mL Injection Solution Inject 1 mL (1,000 mcg total) under the skin Every 7 days    ergocalciferol, vitamin D2, (DRISDOL) 1,250 mcg (50,000 unit) Oral Capsule Take 1 Capsule (50,000 Units total) by mouth Every 7 days (Patient not taking: Reported on 07/06/2023)    escitalopram oxalate (LEXAPRO) 20 mg Oral Tablet Take 1 Tablet (20 mg total) by mouth Once a day for 60 days Indications: bipolar depression, repeated episodes of anxiety    famotidine (PEPCID) 40 mg Oral Tablet Take 1 Tablet (40 mg total) by mouth Once a day    lamoTRIgine (LAMICTAL) 100 mg Oral Tablet Take one tab morning and 1 1/2 tab at bedtime for mood Indications: mood disorder    LORazepam (ATIVAN) 1 mg Oral Tablet Take 1 Tablet (1 mg total) by mouth Twice per day as needed for Anxiety for up to 90 days Indications: anxious    magnesium oxide (MAG-OX) 400 mg Oral Tablet Take 1 Tablet (400 mg total) by mouth Once a day    meclizine (ANTIVERT) 25 mg Oral Tablet Take 1 Tablet (25 mg total) by mouth Every 8 hours as needed    omeprazole (PRILOSEC) 40 mg Oral Capsule, Delayed Release(E.C.) Take 1 Capsule (40 mg total) by mouth Once a day    propranoloL (INDERAL) 80 mg Oral Tablet Take 1 Tablet (80 mg total) by mouth Once a day    risperiDONE (RISPERDAL) 1 mg Oral Tablet Take 1 Tablet (1 mg total) by mouth Every night for 60 days Indications: mania associated with bipolar disorder      Allergies   Allergen Reactions    Levaquin [Levofloxacin] Mental Status Effect     Night terrors and hallucinations    Pertussis Vaccine,Adsorbed  Other Adverse Reaction (Add comment)     Pt can't remember what happened but was put in the hospital.        Family Medical History:       Problem Relation (Age of Onset)    Alcohol abuse Father, Maternal Grandfather, Paternal Grandfather    Anxiety Father, Brother, Daughter    Coronary Artery Disease Other    Depression Father, Brother    Diabetes type II Father    Hypertension (High Blood  Pressure) Mother, Maternal Grandmother, Paternal Grandmother    No Known Problems Daughter, Daughter    Other Brother    Suicidality Maternal Uncle          Social History     Socioeconomic History    Marital status: Married   Tobacco Use    Smoking status: Every Day     Current packs/day: 1.00     Types: Cigarettes     Passive exposure:  Never    Smokeless tobacco: Never   Vaping Use    Vaping status: Former    Substances: Nicotine, Flavoring   Substance and Sexual Activity    Alcohol use: Yes     Comment: occasionally    Drug use: Never      Plan: Treatment and Medication Recommendation: Decrease Lexapro 10 mg by mouth daily with plans to discontinue, start bupropion 75 mg daily with plans to increase as needed, decrease risperidone 0.5mg  by mouth bedtime, continue Lamictal follow up in one month.     Medications will be prescribed and adjusted as indicated by reported symptoms and medication efficacy.    F31.81 Bipolar II  F43.10 Post Traumatic Stress Disorder  F41.0 Panic disorder  F41.1 GAD  F42.9 OCD  F33.1 MDD    Medication Education: Advised of medication AE risks and benefits. Advised to contact provider with complaints, reports, questions, needs, or concerns. Discussed importance of taking medication only as prescribed and keeping all medical and mental health appointments.     Education provided on medical issues that present with increase of depression, i.e., Anemia, Concussion, Diabetes, Epilepsy, Hypothyroidism or hyperthyroidism, Mononucleosis, Vitamin D deficiency and the need to rule out medical issues prior to prescribing psychotropic medications. Discussed the significant role of therapy to address mental health issues.    We have discussed medication adverse-effects, treatment options, and pregnancy precautions if applicable.  The patient verbalizes agreement with this plan. Client agrees with the plan and will be encouraged to contact provider with questions and concerns. Patient has been  instructed follow up plan. Further changes will be dictated by clinical course.      [name], APRN,PMHNP-BC      [facility]

## 2023-08-02 NOTE — Patient Instructions (Addendum)
Take medications only as prescribed. Keep all medical appointments to avoid care interruption. Follow up with your primary care provider (PCP) regularly and obtain blood labs annually.     Your Medications have changed: Decrease Lexapro 10 mg by mouth daily, start bupropion 75 mg every morning, decrease risperidone 0.5mg  by mouth bedtime, continue Lamictal as directed, follow up in one month.     Patient New Medication Education:Bupropion/Wellbutrin is an NDRI (Norepinephrine Dopamine reuptake inhibitor) and is commonly prescribed for major depressive disorder, seasonal affective disorder, nicotine addiction, atypical depression, bipolar depression, ADHD, and sexual dysfunction.  Bupropion is also used for smoking cessation, and can be helpful in weight management.  Onset of therapeutic action between 2 in 8 weeks.    Notable side effects include dry mouth, constipation, nausea, weight loss, anorexia, myalgia, insomnia, dizziness, headaches, agitation, anxiety, tremor, abdominal pain, tinnitus, sweating, rash, hypertension    More serious life-threatening side effects:  Rare seizures, anaphylactic reactions including Trudie Buckler syndrome, hypomania, suicidal ideation    Consider potential drug drug interactions with Depakote, Prozac, Effexor, Viibryd, risperidone, metoprolol, Ambien, tramadol, Strattera    Best augmenting combos include trazodone for insomnia, benzodiazepine for anxiety P, SSRIs to reverse sexual dysfunction with SSRI, mood stabilizers and/or atypical antipsychotics and bipolar depression, mood stabilizers or antipsychotic for treatment resistant depression, hypnotics for insomnia, mirtazapine, modafinil, Strattera    No adverse effects noted in animals during the first trimester/ best advise stop before pregnancy and discontinue until no longer breastfeeding.   What to do when medication does not work:...  Wait! Wait! Wait!  Taken lining is nighttime insomnia take at night if daytime sedation  with you we or month before switching to another agent or adding other medications   Avoid mixing medications with drugs and alcohol.     Call the [facility] office if symptoms worsen or problems arise.  [phone].    *NOTE: It is best to consult your mental health provider before stopping medications.   When stopping mental health medications it is best to taper the medication to avoid withdrawal effects: dizziness, nausea, stomach cramps, sweating, tingling, or flu-like withdrawal symptoms, that  emerge during discontinuation. If these symptoms occur, residual or restart the medication to stop symptoms and then restart to taper down much more slowly.      Withdrawal symptoms can be more common or more severe with Effexor than some other antidepressants.  Abrupt discontinuation of fluoxetine/ Prozac because of the long half life, generally does not cause withdrawal symptoms.There is no need to taper stimulants, modafinil, buspirone, or hydroxyzine.

## 2023-08-30 ENCOUNTER — Other Ambulatory Visit: Payer: Self-pay

## 2023-08-30 ENCOUNTER — Encounter (HOSPITAL_PSYCHIATRIC): Payer: Self-pay | Admitting: NURSE PRACTITIONER

## 2023-08-30 ENCOUNTER — Ambulatory Visit: Payer: BC Managed Care – PPO | Attending: NURSE PRACTITIONER | Admitting: NURSE PRACTITIONER

## 2023-08-30 VITALS — BP 143/67 | HR 96 | Resp 18 | Ht 63.0 in | Wt 193.0 lb

## 2023-08-30 DIAGNOSIS — F410 Panic disorder [episodic paroxysmal anxiety] without agoraphobia: Secondary | ICD-10-CM | POA: Insufficient documentation

## 2023-08-30 DIAGNOSIS — F429 Obsessive-compulsive disorder, unspecified: Secondary | ICD-10-CM | POA: Insufficient documentation

## 2023-08-30 DIAGNOSIS — F411 Generalized anxiety disorder: Secondary | ICD-10-CM | POA: Insufficient documentation

## 2023-08-30 DIAGNOSIS — F4310 Post-traumatic stress disorder, unspecified: Secondary | ICD-10-CM | POA: Insufficient documentation

## 2023-08-30 DIAGNOSIS — F331 Major depressive disorder, recurrent, moderate: Secondary | ICD-10-CM | POA: Insufficient documentation

## 2023-08-30 DIAGNOSIS — F3181 Bipolar II disorder: Secondary | ICD-10-CM | POA: Insufficient documentation

## 2023-08-30 MED ORDER — LORAZEPAM 1 MG TABLET
1.0000 mg | ORAL_TABLET | Freq: Two times a day (BID) | ORAL | 3 refills | Status: DC | PRN
Start: 2023-08-30 — End: 2023-09-21

## 2023-08-30 MED ORDER — BUPROPION HCL 100 MG TABLET
100.0000 mg | ORAL_TABLET | Freq: Every day | ORAL | 0 refills | Status: DC
Start: 2023-08-30 — End: 2023-09-21

## 2023-08-30 MED ORDER — RISPERIDONE 0.5 MG TABLET
0.5000 mg | ORAL_TABLET | Freq: Every evening | ORAL | 0 refills | Status: DC
Start: 2023-08-30 — End: 2023-09-21

## 2023-08-30 MED ORDER — LAMOTRIGINE 100 MG TABLET
ORAL_TABLET | ORAL | 0 refills | Status: DC
Start: 2023-08-30 — End: 2023-09-21

## 2023-08-30 NOTE — Patient Instructions (Addendum)
Take medications only as prescribed. Keep all medical appointments to avoid care interruption. Follow up with your primary care provider (PCP) regularly and obtain blood labs annually.     Your Medications have changed: increased bupropion 100 mg by mouth daily, increased number of tablets for Ativan 1mg  bid, continue Lamictal 100 mg bid, continue risperidone 0.5mg  by mouth daily.     What to do when medication does not work:...  Wait! Wait! Wait!  Taken lining is nighttime insomnia take at night if daytime sedation with you we or month before switching to another agent or adding other medications   Avoid mixing medications with drugs and alcohol.     Call the [facility] office if symptoms worsen or problems arise.  [phone].    *NOTE: It is best to consult your mental health provider before stopping medications.   When stopping mental health medications it is best to taper the medication to avoid withdrawal effects: dizziness, nausea, stomach cramps, sweating, tingling, or flu-like withdrawal symptoms, that  emerge during discontinuation. If these symptoms occur, residual or restart the medication to stop symptoms and then restart to taper down much more slowly.      Withdrawal symptoms can be more common or more severe with Effexor than some other antidepressants.  Abrupt discontinuation of fluoxetine/ Prozac because of the long half life, generally does not cause withdrawal symptoms.There is no need to taper stimulants, modafinil, buspirone, or hydroxyzine.

## 2023-08-30 NOTE — Progress Notes (Signed)
Progress Note    Patient's Full Name: Latoya Harrison   Patient's Date of Birth: 1976/08/06   Patient's Age: 47 y.o.   Patient's Legal Sex: female   Patient's MRN: [id number]     Current Date: 08/30/2023 10:12       CC: " doing ok "     History of Present Illness:Latoya Harrison 47 y.o. White female presents for medication management.   Last appointment we decreased Lexapro 10 mg by mouth daily with plans to discontinue, start bupropion 75 mg daily with plans to increase as needed, decrease risperidone 0.5mg  by mouth bedtime, continue Lamictal 100 mg daily, continued Ativan 1mg  by mouth daily.    Today client relays "I am having more anxiety and depression but maybe its the time of year." "Christmas gets me, losses, Thanksgiving my husbands mom passed Thanksgiving and between all of that and a hysterectomy and life changes, I just have adjustment disorder that threw me into depression." Has completely discontinue Lexapro.     Denies suicidal, homicidal and self-harm thoughts or intentions. [name]  denies auditory, visual, tactile hallucinations, no delusions, no paranoia.  Reports sleep as good, appetite adequate and mood as stable. . Denies adverse reactions to medications. Verbal consent to treat given. Reviewed treatment plan with client.  Denies questions or further concerns at this time.  Advised to contact the [facility] as needed for appointments, concerns, questions, or medication refills.     Vitals:    08/30/23 0950   BP: (!) 143/67   Pulse: 96   Resp: 18   Weight: 87.5 kg (193 lb)   Height: 1.6 m (5\' 3" )   BMI: 34.19     During this visit we completed the PHQ9 (17 );    Plan: Treatment and Medication Recommendation: increased bupropion 100 mg by mouth daily, increased number of tablets for Ativan 1mg  bid, continue Lamictal 100 mg , continue risperidone 0.5mg  by mouth daily. Medications will be prescribed and adjusted as indicated by reported symptoms and medication  efficacy.    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 Advised to contact provider with complaints, reports, questions, needs, or concerns. Discussed importance of taking medication only as prescribed and keeping all medical and mental health appointments. Follow up in one month.    We have discussed medication adverse-effects, treatment options, and pregnancy precautions if applicable.  The patient verbalizes agreement with this plan. Client agrees with the plan and will be encouraged to contact provider with questions and concerns. Patient has been instructed follow up plan. Further changes will be dictated by clinical course.    [name], APRN,PMHNP-BC  [facility]

## 2023-09-04 ENCOUNTER — Other Ambulatory Visit: Payer: BC Managed Care – PPO | Attending: PSYCHIATRY AND NEUROLOGY-NEUROLOGY

## 2023-09-04 ENCOUNTER — Other Ambulatory Visit: Payer: Self-pay

## 2023-09-04 DIAGNOSIS — F419 Anxiety disorder, unspecified: Secondary | ICD-10-CM | POA: Insufficient documentation

## 2023-09-04 DIAGNOSIS — G43709 Chronic migraine without aura, not intractable, without status migrainosus: Secondary | ICD-10-CM | POA: Insufficient documentation

## 2023-09-04 DIAGNOSIS — R42 Dizziness and giddiness: Secondary | ICD-10-CM | POA: Insufficient documentation

## 2023-09-04 LAB — COMPREHENSIVE METABOLIC PANEL, NON-FASTING
ALBUMIN/GLOBULIN RATIO: 1.3 (ref 0.8–1.4)
ALBUMIN: 4.4 g/dL (ref 3.5–5.7)
ALKALINE PHOSPHATASE: 87 U/L (ref 34–104)
ALT (SGPT): 23 U/L (ref 7–52)
ANION GAP: 9 mmol/L (ref 4–13)
AST (SGOT): 17 U/L (ref 13–39)
BILIRUBIN TOTAL: 0.4 mg/dL (ref 0.3–1.0)
BUN/CREA RATIO: 14 (ref 6–22)
BUN: 11 mg/dL (ref 7–25)
CALCIUM, CORRECTED: 9 mg/dL (ref 8.9–10.8)
CALCIUM: 9.3 mg/dL (ref 8.6–10.3)
CHLORIDE: 105 mmol/L (ref 98–107)
CO2 TOTAL: 25 mmol/L (ref 21–31)
CREATININE: 0.8 mg/dL (ref 0.60–1.30)
ESTIMATED GFR: 93 mL/min/{1.73_m2} (ref >59)
GLOBULIN: 3.4 (ref 2.0–3.5)
GLUCOSE: 104 mg/dL (ref 74–109)
OSMOLALITY, CALCULATED: 277 mosm/kg (ref 270–290)
POTASSIUM: 4.1 mmol/L (ref 3.5–5.1)
PROTEIN TOTAL: 7.8 g/dL (ref 6.4–8.9)
SODIUM: 139 mmol/L (ref 136–145)

## 2023-09-04 LAB — CBC WITH DIFF
BASOPHIL #: 0.1 10*3/uL (ref 0.00–0.10)
BASOPHIL %: 1 % (ref 0–1)
EOSINOPHIL #: 0.1 10*3/uL (ref 0.00–0.50)
EOSINOPHIL %: 2 % (ref 1–7)
HCT: 43.4 % — ABNORMAL HIGH (ref 31.2–41.9)
HGB: 14.8 g/dL — ABNORMAL HIGH (ref 10.9–14.3)
LYMPHOCYTE #: 2.6 10*3/uL (ref 1.00–3.00)
LYMPHOCYTE %: 39 % (ref 16–44)
MCH: 29 pg (ref 24.7–32.8)
MCHC: 34 g/dL (ref 32.3–35.6)
MCV: 85.2 fL (ref 75.5–95.3)
MONOCYTE #: 0.5 10*3/uL (ref 0.30–1.00)
MONOCYTE %: 7 % (ref 5–13)
MPV: 9.6 fL (ref 7.9–10.8)
NEUTROPHIL #: 3.4 10*3/uL (ref 1.85–7.80)
NEUTROPHIL %: 51 % (ref 43–77)
PLATELETS: 220 10*3/uL (ref 140–440)
RBC: 5.1 10*6/uL — ABNORMAL HIGH (ref 3.63–4.92)
RDW: 14.4 % (ref 12.3–17.7)
WBC: 6.7 10*3/uL (ref 3.8–11.8)

## 2023-09-04 LAB — THYROID STIMULATING HORMONE (SENSITIVE TSH): TSH: 2.491 u[IU]/mL (ref 0.450–5.330)

## 2023-09-04 LAB — VITAMIN B12: VITAMIN B 12: 467 pg/mL (ref 180–914)

## 2023-09-05 ENCOUNTER — Encounter (INDEPENDENT_AMBULATORY_CARE_PROVIDER_SITE_OTHER): Payer: Self-pay | Admitting: PHYSICIAN ASSISTANT

## 2023-09-06 ENCOUNTER — Ambulatory Visit (INDEPENDENT_AMBULATORY_CARE_PROVIDER_SITE_OTHER): Payer: Self-pay | Admitting: PHYSICIAN ASSISTANT

## 2023-09-06 ENCOUNTER — Other Ambulatory Visit: Payer: Self-pay

## 2023-09-06 ENCOUNTER — Encounter (INDEPENDENT_AMBULATORY_CARE_PROVIDER_SITE_OTHER): Payer: Self-pay | Admitting: PHYSICIAN ASSISTANT

## 2023-09-06 VITALS — BP 117/75 | HR 70 | Temp 97.6°F | Ht 63.0 in | Wt 195.0 lb

## 2023-09-06 DIAGNOSIS — R7303 Prediabetes: Secondary | ICD-10-CM | POA: Insufficient documentation

## 2023-09-06 DIAGNOSIS — R42 Dizziness and giddiness: Secondary | ICD-10-CM

## 2023-09-06 DIAGNOSIS — R1013 Epigastric pain: Secondary | ICD-10-CM

## 2023-09-06 DIAGNOSIS — Z87898 Personal history of other specified conditions: Secondary | ICD-10-CM | POA: Insufficient documentation

## 2023-09-06 DIAGNOSIS — R519 Headache, unspecified: Secondary | ICD-10-CM

## 2023-09-06 DIAGNOSIS — E538 Deficiency of other specified B group vitamins: Secondary | ICD-10-CM

## 2023-09-06 DIAGNOSIS — K219 Gastro-esophageal reflux disease without esophagitis: Secondary | ICD-10-CM

## 2023-09-06 DIAGNOSIS — F411 Generalized anxiety disorder: Secondary | ICD-10-CM

## 2023-09-06 DIAGNOSIS — Z8719 Personal history of other diseases of the digestive system: Secondary | ICD-10-CM | POA: Insufficient documentation

## 2023-09-06 DIAGNOSIS — Z1211 Encounter for screening for malignant neoplasm of colon: Secondary | ICD-10-CM

## 2023-09-06 DIAGNOSIS — G8929 Other chronic pain: Secondary | ICD-10-CM

## 2023-09-06 DIAGNOSIS — E559 Vitamin D deficiency, unspecified: Secondary | ICD-10-CM

## 2023-09-06 DIAGNOSIS — F32A Depression, unspecified: Secondary | ICD-10-CM | POA: Insufficient documentation

## 2023-09-06 MED ORDER — PROPRANOLOL 80 MG TABLET
80.0000 mg | ORAL_TABLET | Freq: Every day | ORAL | 0 refills | Status: DC
Start: 2023-09-06 — End: 2023-10-06

## 2023-09-06 MED ORDER — MAGNESIUM OXIDE 400 MG (241.3 MG MAGNESIUM) TABLET
400.0000 mg | ORAL_TABLET | Freq: Every day | ORAL | 0 refills | Status: DC
Start: 2023-09-06 — End: 2023-12-08

## 2023-09-06 MED ORDER — CYANOCOBALAMIN (VIT B-12) 1,000 MCG/ML INJECTION SOLUTION
1000.0000 ug | INTRAMUSCULAR | 0 refills | Status: DC
Start: 2023-09-06 — End: 2023-12-08

## 2023-09-06 MED ORDER — ERGOCALCIFEROL (VITAMIN D2) 1,250 MCG (50,000 UNIT) CAPSULE
50000.0000 [IU] | ORAL_CAPSULE | ORAL | 0 refills | Status: DC
Start: 2023-09-06 — End: 2023-12-08

## 2023-09-06 MED ORDER — MECLIZINE 25 MG TABLET
25.0000 mg | ORAL_TABLET | Freq: Three times a day (TID) | ORAL | 0 refills | Status: DC | PRN
Start: 2023-09-06 — End: 2023-12-08

## 2023-09-06 MED ORDER — FAMOTIDINE 40 MG TABLET
40.0000 mg | ORAL_TABLET | Freq: Two times a day (BID) | ORAL | 0 refills | Status: DC
Start: 2023-09-06 — End: 2023-12-08

## 2023-09-06 MED ORDER — OMEPRAZOLE 40 MG CAPSULE,DELAYED RELEASE
40.0000 mg | DELAYED_RELEASE_CAPSULE | Freq: Every day | ORAL | 0 refills | Status: DC
Start: 2023-09-06 — End: 2023-12-08

## 2023-09-06 NOTE — Assessment & Plan Note (Addendum)
Orders:    magnesium oxide (MAG-OX) 400 mg Oral Tablet; Take 1 Tablet (400 mg total) by mouth Once a day for 90 days

## 2023-09-06 NOTE — Assessment & Plan Note (Addendum)
Orders:    propranoloL (INDERAL) 80 mg Oral Tablet; Take 1 Tablet (80 mg total) by mouth Once a day for 90 days

## 2023-09-06 NOTE — Assessment & Plan Note (Signed)
We will increase her Pepcid to bid. Referral placed to gen surg Dr. Donnal Debar for consult for EGD.   Orders:    omeprazole (PRILOSEC) 40 mg Oral Capsule, Delayed Release(E.C.); Take 1 Capsule (40 mg total) by mouth Once a day for 90 days    famotidine (PEPCID) 40 mg Oral Tablet; Take 1 Tablet (40 mg total) by mouth Twice daily for 90 days    Referral to GENERAL SURGERY - [place] - DUREMDES, MULLINS, HOPKINS; Future

## 2023-09-06 NOTE — Progress Notes (Signed)
[facility]  FAMILY MEDICINE, MEDICAL OFFICE BUILDING  [address]  [place] [address]  [phone]   Latoya Harrison  1976/08/29  [id number]    Date of Service: 09/06/2023  Chief complaint:   Chief Complaint   Patient presents with    Follow Up     Pt states pain in her middle of the stomach and thinks that it would be indigestion but if you touch it or cough it hurts and causes trouble sleeping/ need meds refill too.      Subjective:   Latoya Harrison is a 47 y.o. female and presents today for follow-up on chronic disease management. She is known to me from my previous practice at [facility].     She c/o pain in epigastric area x 2 weeks. No increased heartburn. Tender to touch. Worse at night. Sharp pains. Denies black stools. No aggravating or alleviating factors. She is taking Pepcid and Omeprazole. She notes her last EGD was 3-5 years ago. She is also due for colonoscopy.     She is following with psych at the [facility] for management of anxiety.   Her mammo is UTD done with Dr. Dewaine Conger.   PAP smears typically with Dr. Abel Presto. Hx of hysterectomy due to abnormal pap. She has been unable to schedule with him, she is going to schedule with a different OBGYN.    Denies chest pain, SOB, heart flutters, nausea, vomiting, diarrhea, constipation.     Past Medical History:   Diagnosis Date    Acid reflux     B12 deficiency     Cervical disc disease     Chronic headaches     Depression     GAD (generalized anxiety disorder)     H/O dizziness     History of IBS     Iron deficiency anemia, unspecified     Low magnesium level     OCD (obsessive compulsive disorder)     Panic attacks     Prediabetes     PTSD (post-traumatic stress disorder)     Recurrent major depression (CMS HCC)     TMJ (dislocation of temporomandibular joint)     Vitamin B 12 deficiency     Vitamin D deficiency          Past Surgical History:   Procedure Laterality Date    COLONOSCOPY N/A  2021    Dr.Patel    ESOPHAGOGASTRODUODENOSCOPY  2021    HX HYSTERECTOMY N/A 07/2020    HX TUBAL LIGATION           Current Outpatient Medications   Medication Sig Dispense Refill    buPROPion (WELLBUTRIN) 100 mg Oral Tablet Take 1 Tablet (100 mg total) by mouth Once a day for 30 days Indications: bipolar depression 30 Tablet 0    cyanocobalamin (VITAMIN B12) 1,000 mcg/mL Injection Solution Inject 1 mL (1,000 mcg total) under the skin Every 7 days for 90 days 12 mL 0    ergocalciferol, vitamin D2, (DRISDOL) 1,250 mcg (50,000 unit) Oral Capsule Take 1 Capsule (50,000 Units total) by mouth Every 7 days for 90 days 12 Capsule 0    famotidine (PEPCID) 40 mg Oral Tablet Take 1 Tablet (40 mg total) by mouth Twice daily for 90 days 180 Tablet 0    lamoTRIgine (LAMICTAL) 100 mg Oral Tablet Take one tab morning and 1 1/2 tab at bedtime for mood Indications: bipolar depression, mood disorder (Patient taking differently: Take 1 Tablet (100  mg total) by mouth Twice daily Take one tab morning and 1 1/2 tab at bedtime for mood Indications: bipolar depression, mood disorder) 75 Tablet 0    LORazepam (ATIVAN) 1 mg Oral Tablet Take 1 Tablet (1 mg total) by mouth Twice per day as needed for Anxiety for up to 90 days Indications: anxious 45 Tablet 3    magnesium oxide (MAG-OX) 400 mg Oral Tablet Take 1 Tablet (400 mg total) by mouth Once a day for 90 days 90 Tablet 0    meclizine (ANTIVERT) 25 mg Oral Tablet Take 1 Tablet (25 mg total) by mouth Every 8 hours as needed 30 Tablet 0    omeprazole (PRILOSEC) 40 mg Oral Capsule, Delayed Release(E.C.) Take 1 Capsule (40 mg total) by mouth Once a day for 90 days 90 Capsule 0    propranoloL (INDERAL) 80 mg Oral Tablet Take 1 Tablet (80 mg total) by mouth Once a day for 90 days 90 Tablet 0    risperiDONE (RISPERDAL) 0.5 mg Oral Tablet Take 1 Tablet (0.5 mg total) by mouth Every night for 30 days Indications: bipolar depression 30 Tablet 0    sertraline (ZOLOFT) 100 mg Oral Tablet Take 1 Tablet  (100 mg total) by mouth Once a day (Patient not taking: Reported on 09/06/2023)       No current facility-administered medications for this visit.     Allergies   Allergen Reactions    Levaquin [Levofloxacin] Mental Status Effect     Night terrors and hallucinations    Pertussis Vaccine,Adsorbed  Other Adverse Reaction (Add comment)     Pt can't remember what happened but was put in the hospital.     Review of Systems:  Any pertinent Review of Systems as addressed in the HPI above.    Objective:   BP 117/75   Pulse 70   Temp 36.4 C (97.6 F) (Temporal)   Ht 1.6 m (5\' 3" )   Wt 88.5 kg (195 lb)   SpO2 95%   BMI 34.54 kg/m       BMI addressed: Advised on diet, weight loss, and exercise to reduce above normal BMI.       Constitutional: No acute distress. Alert and oriented.  Head: Normocephalic, atraumatic.  Eyes: Clear. PERRL. EOMI.  Ears: Canals and TMs clear and intact bilaterally.  Nose: Patent. No rhinorrhea.  Throat: Clear, no erythema or drainge.  Neck: Supple. No lymphadenopathy. No bruit.   Lungs: Clear to auscultation bilaterally. No wheezes, rhonchi, or rales.  Heart: Regular rate and rhythm. No murmurs.  Abdomen: Soft, nondistended. Mild epigastric tenderness without rebound, guarding, or rigidity. No palpable hernia or deformity noted. BS active.  Extremities: Intact x 4. No edema. Gait stable.  Neuro: CN 2-12 grossly intact.   Psych: Mood and affect appropriate and congruent.    Laboratory:  A1C: 6  A1C Date: 11/01/2022  COMPLETE BLOOD COUNT   Lab Results   Component Value Date    WBC 6.7 09/04/2023    HGB 14.8 (H) 09/04/2023    HCT 43.4 (H) 09/04/2023    PLTCNT 220 09/04/2023       DIFFERENTIAL  Lab Results   Component Value Date    PMNS 51 09/04/2023    LYMPHOCYTES 39 09/04/2023    MONOCYTES 7 09/04/2023    EOSINOPHIL 2 09/04/2023    BASOPHILS 1 09/04/2023    BASOPHILS 0.10 09/04/2023    PMNABS 3.40 09/04/2023    LYMPHSABS 2.60 09/04/2023    EOSABS 0.10 09/04/2023  MONOSABS 0.50 09/04/2023      VITAMIN B12  Lab Results   Component Value Date    VITB12 467 09/04/2023     VITAMIN D 25-HYDROXY  Lab Results   Component Value Date    VITD 19 (L) 11/01/2022        Most recent lab results reviewed with patient.    Assessment & Plan  Hypomagnesemia    Orders:    magnesium oxide (MAG-OX) 400 mg Oral Tablet; Take 1 Tablet (400 mg total) by mouth Once a day for 90 days    Vitamin D deficiency    Orders:    ergocalciferol, vitamin D2, (DRISDOL) 1,250 mcg (50,000 unit) Oral Capsule; Take 1 Capsule (50,000 Units total) by mouth Every 7 days for 90 days    B12 deficiency    Orders:    cyanocobalamin (VITAMIN B12) 1,000 mcg/mL Injection Solution; Inject 1 mL (1,000 mcg total) under the skin Every 7 days for 90 days    Chronic headaches    Orders:    propranoloL (INDERAL) 80 mg Oral Tablet; Take 1 Tablet (80 mg total) by mouth Once a day for 90 days    Gastroesophageal reflux disease, unspecified whether esophagitis present  We will increase her Pepcid to bid. Referral placed to gen surg Dr. Donnal Debar for consult for EGD.   Orders:    omeprazole (PRILOSEC) 40 mg Oral Capsule, Delayed Release(E.C.); Take 1 Capsule (40 mg total) by mouth Once a day for 90 days    famotidine (PEPCID) 40 mg Oral Tablet; Take 1 Tablet (40 mg total) by mouth Twice daily for 90 days    Referral to GENERAL SURGERY - [place] - DUREMDES, MULLINS, HOPKINS; Future    Epigastric pain  Will increase her Pepcid to BID. Referral placed to Dr. Donnal Debar for consult for EGD. If sxs persist she should call the clinic and we will increase her Omeprazole while awaiting her appt.   Orders:    omeprazole (PRILOSEC) 40 mg Oral Capsule, Delayed Release(E.C.); Take 1 Capsule (40 mg total) by mouth Once a day for 90 days    famotidine (PEPCID) 40 mg Oral Tablet; Take 1 Tablet (40 mg total) by mouth Twice daily for 90 days    Referral to GENERAL SURGERY - [place] - DUREMDES, MULLINS, HOPKINS; Future    Screening for colon cancer    Orders:    Referral to  GENERAL SURGERY -  - DUREMDES, MULLINS, HOPKINS; Future    Dizziness  She recently saw neurology. They have ordered myasthenia gravis testing which is pending  Orders:    meclizine (ANTIVERT) 25 mg Oral Tablet; Take 1 Tablet (25 mg total) by mouth Every 8 hours as needed    GAD (generalized anxiety disorder)  Patient will continue with psych at the [facility]              Counseling with regards to preventative care given. Medication summary was discussed with the patient to verify compliance and understanding. A good faith effort was made to reconcile the patient's medications.     Refill orders were placed on Pepcid, Prilosec, Vitamin D, B12, Mg, and Propranolol    The patient was given the opportunity to ask questions and those questions were answered to the patient's satisfaction. The patient was encouraged to call with any additional questions or concerns. More than 50% of the visit was spent counseling and coordinating care.    Orders Placed This Encounter    Referral to GENERAL SURGERY -   - DUREMDES, MULLINS, HOPKINS    cyanocobalamin (VITAMIN B12) 1,000 mcg/mL Injection Solution    propranoloL (INDERAL) 80 mg Oral Tablet    omeprazole (PRILOSEC) 40 mg Oral Capsule, Delayed Release(E.C.)    magnesium oxide (MAG-OX) 400 mg Oral Tablet    famotidine (PEPCID) 40 mg Oral Tablet    ergocalciferol, vitamin D2, (DRISDOL) 1,250 mcg (50,000 unit) Oral Capsule    meclizine (ANTIVERT) 25 mg Oral Tablet                  Return in about 3 months (around 12/05/2023), or if symptoms worsen or fail to improve.  [address], PA-C 09/06/2023, 09:57

## 2023-09-06 NOTE — Assessment & Plan Note (Addendum)
 Orders:    ergocalciferol, vitamin D2, (DRISDOL) 1,250 mcg (50,000 unit) Oral Capsule; Take 1 Capsule (50,000 Units total) by mouth Every 7 days for 90 days

## 2023-09-06 NOTE — Assessment & Plan Note (Signed)
Patient will continue with psych at the [facility]

## 2023-09-12 IMAGING — MR MRI BRAIN W/O CONTRAST
9 series · 48 of 48 positions shown · non-contrast
Comparison: None available.

﻿EXAM:  MRI BRAIN W/O CONTRAST
INDICATION: Dizziness, giddiness, chronic migraines.
TECHNIQUE: Noncontrast multiplanar, multisequence MRI was performed.

[Series 5: DWI · axial · 5.0mm · 1.35mm/px · z∈[-51,+75]mm · 15 of 88 slices shown (1 of 3)]
[im 1/88]
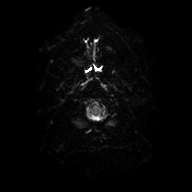
[im 7/88]
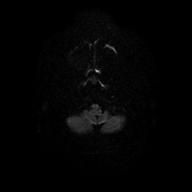
[im 13/88]
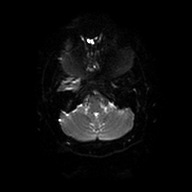
[im 19/88]
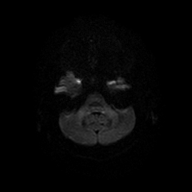
[im 25/88]
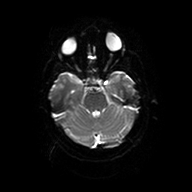
[im 32/88]
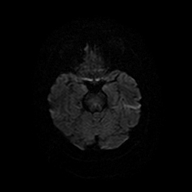
[im 38/88]
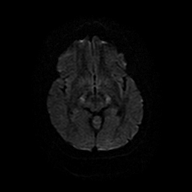
[im 44/88]
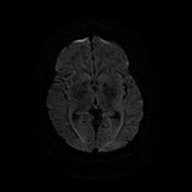
[im 50/88]
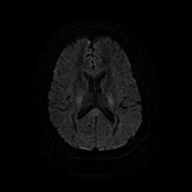
[im 56/88]
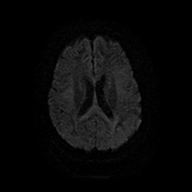
[im 63/88]
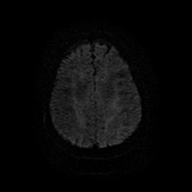
[im 69/88]
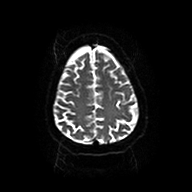
[im 75/88]
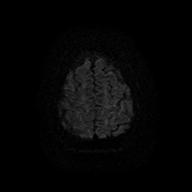
[im 81/88]
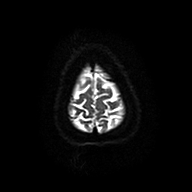
[im 88/88]
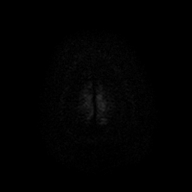

[Series 6: DWI · axial · 5.0mm · 1.35mm/px · z∈[-51,+75]mm · 4 of 22 slices shown (2 of 3)]
[im 1/22]
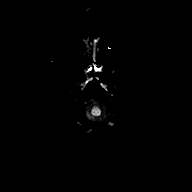
[im 8/22]
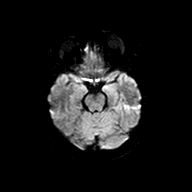
[im 15/22]
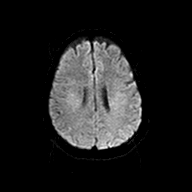
[im 22/22]
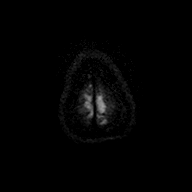

[Series 7: DWI · axial · 5.0mm · 1.35mm/px · z∈[-51,+75]mm · 4 of 22 slices shown (3 of 3)]
[im 1/22]
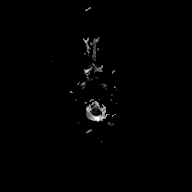
[im 8/22]
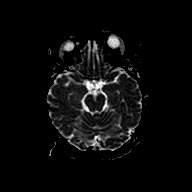
[im 15/22]
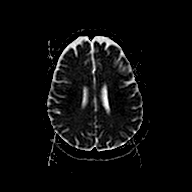
[im 22/22]
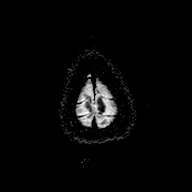

[Series 8: FLAIR · sagittal · 4.0mm · 0.75mm/px · 5 of 26 slices shown (1 of 2)]
[im 1/26]
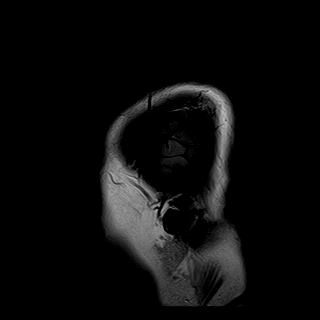
[im 7/26]
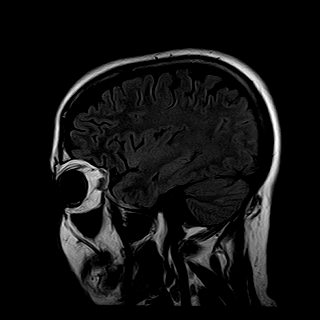
[im 13/26]
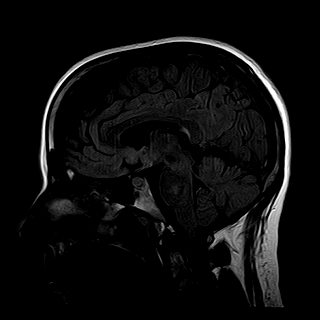
[im 19/26]
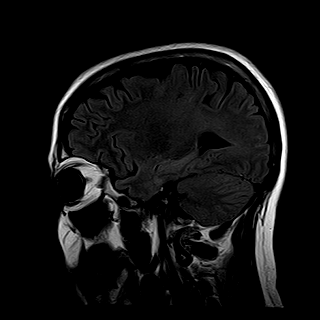
[im 26/26]
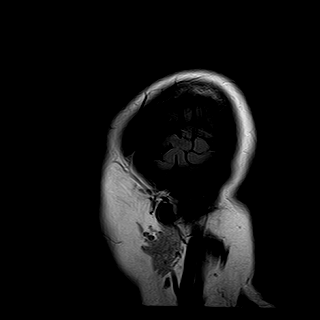

[Series 9: T2 · axial · 5.0mm · 0.43mm/px · z∈[-62,+82]mm · 4 of 25 slices shown (1 of 2)]
[im 1/25]
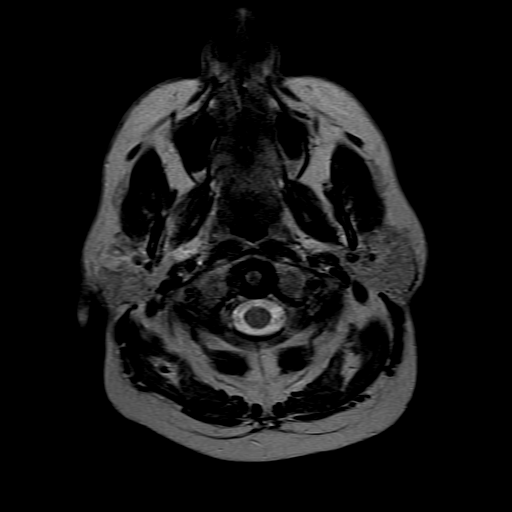
[im 9/25]
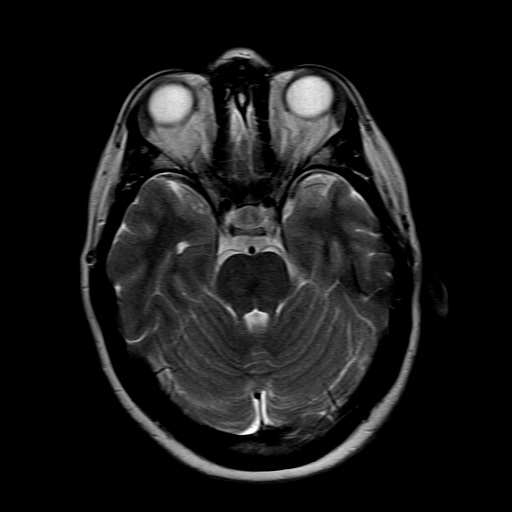
[im 17/25]
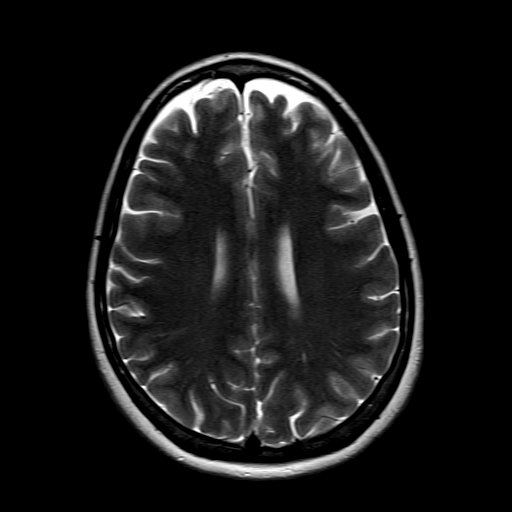
[im 25/25]
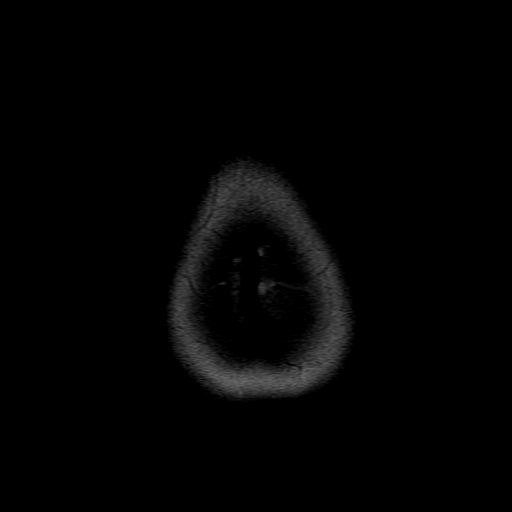

[Series 10: FLAIR · axial · 5.0mm · 0.76mm/px · z∈[-54,+72]mm · 4 of 22 slices shown (2 of 2)]
[im 1/22]
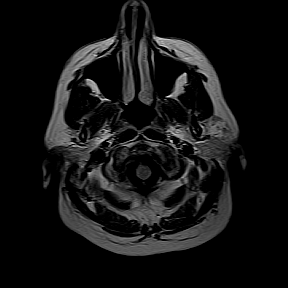
[im 8/22]
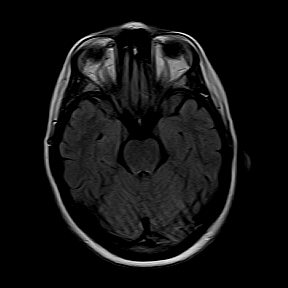
[im 15/22]
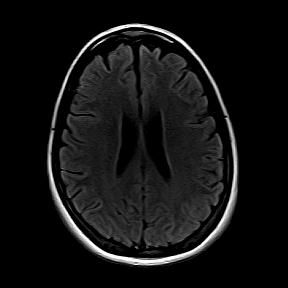
[im 22/22]
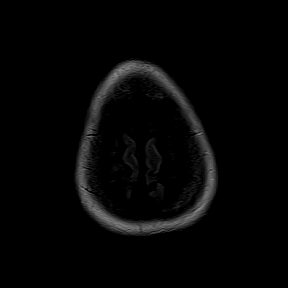

[Series 11: T1 · axial · 5.0mm · 0.69mm/px · z∈[-54,+72]mm · 4 of 22 slices shown]
[im 1/22]
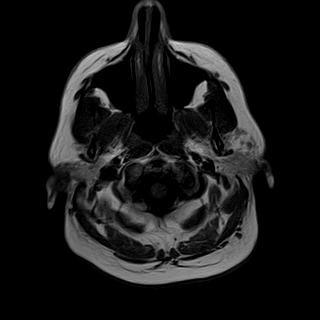
[im 8/22]
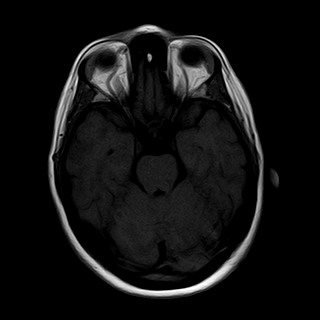
[im 15/22]
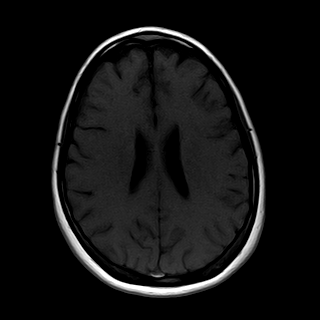
[im 22/22]
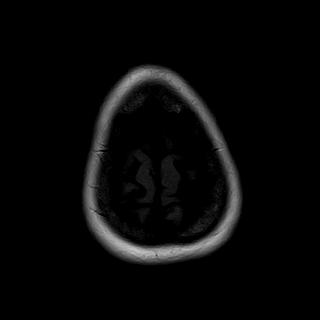

[Series 12: T2-star · axial · 5.0mm · 0.69mm/px · z∈[-54,+72]mm · 4 of 22 slices shown]
[im 1/22]
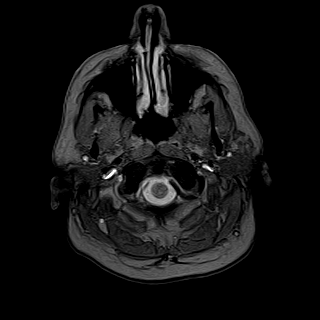
[im 8/22]
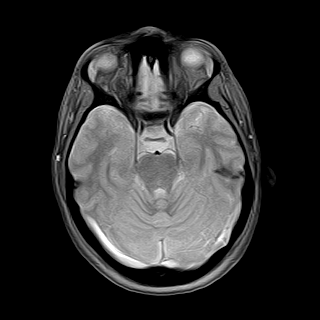
[im 15/22]
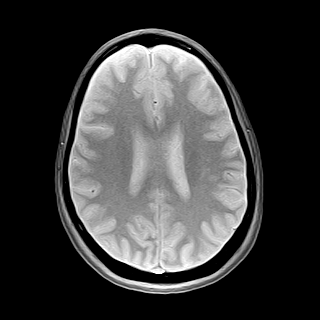
[im 22/22]
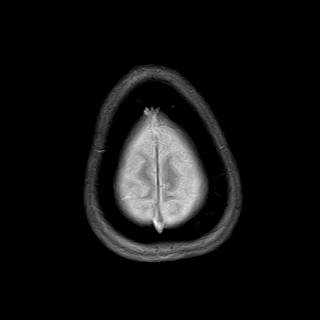

[Series 13: T2 · coronal · 6.0mm · 0.43mm/px · 4 of 24 slices shown (2 of 2)]
[im 1/24]
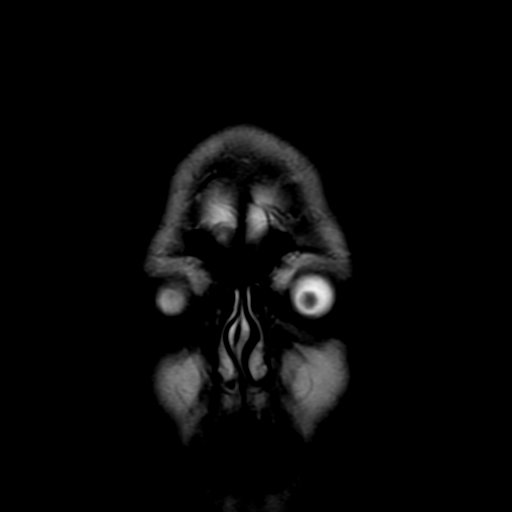
[im 8/24]
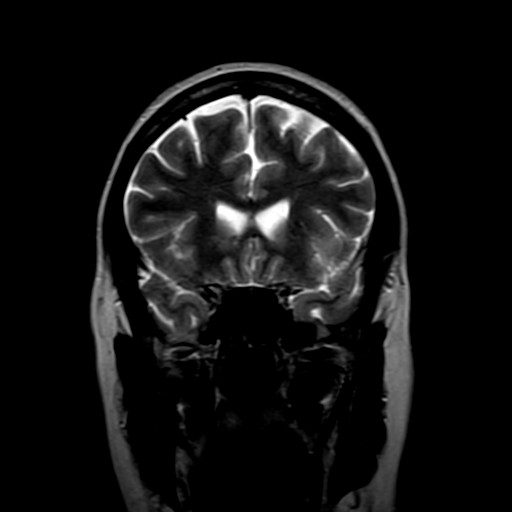
[im 16/24]
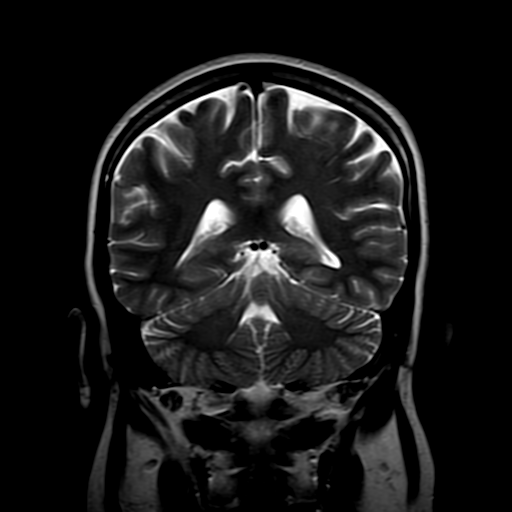
[im 24/24]
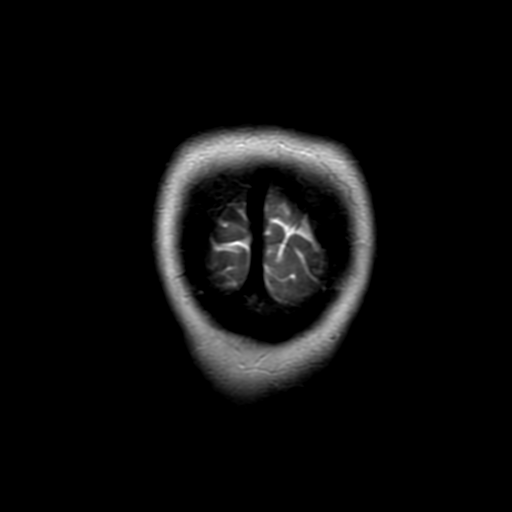

[48 of 48 positions shown; findings below may reference images not displayed]

FINDINGS: There is no mass, hemorrhage, shift of the midline structures, or extra-axial collection.  The ventricles and CSF spaces appear within normal limits.  There is no significant white matter disease.  

The paranasal sinuses and mastoid air cells appear clear.  The orbits are unremarkable.
IMPRESSION: Normal examination.

## 2023-09-14 LAB — MUSK ANTIBODY
INTERPRETATION: NEGATIVE
TECHNICAL RESULT: <1:10 {titer}

## 2023-09-14 LAB — MYASTHENIA GRAVIS PANEL 2 W/REFL MUSK AB
ACETYLCHOLINE REC BIND AB: <0.3 nmol/L
ACETYLCHOLINE REC BLOC AB  - EAST: <15 (ref <15)
ACETYLCHOLINE REC MOD AB: 22 %

## 2023-09-21 ENCOUNTER — Ambulatory Visit: Payer: BC Managed Care – PPO | Attending: NURSE PRACTITIONER | Admitting: NURSE PRACTITIONER

## 2023-09-21 ENCOUNTER — Encounter (HOSPITAL_PSYCHIATRIC): Payer: Self-pay | Admitting: NURSE PRACTITIONER

## 2023-09-21 ENCOUNTER — Other Ambulatory Visit: Payer: Self-pay

## 2023-09-21 VITALS — BP 126/65 | HR 82 | Resp 18 | Ht 63.0 in | Wt 197.4 lb

## 2023-09-21 DIAGNOSIS — F4310 Post-traumatic stress disorder, unspecified: Secondary | ICD-10-CM | POA: Insufficient documentation

## 2023-09-21 DIAGNOSIS — F429 Obsessive-compulsive disorder, unspecified: Secondary | ICD-10-CM | POA: Insufficient documentation

## 2023-09-21 DIAGNOSIS — F410 Panic disorder [episodic paroxysmal anxiety] without agoraphobia: Secondary | ICD-10-CM | POA: Insufficient documentation

## 2023-09-21 DIAGNOSIS — F411 Generalized anxiety disorder: Secondary | ICD-10-CM | POA: Insufficient documentation

## 2023-09-21 DIAGNOSIS — F32A Depression, unspecified: Secondary | ICD-10-CM | POA: Insufficient documentation

## 2023-09-21 DIAGNOSIS — F3181 Bipolar II disorder: Secondary | ICD-10-CM | POA: Insufficient documentation

## 2023-09-21 DIAGNOSIS — F331 Major depressive disorder, recurrent, moderate: Secondary | ICD-10-CM | POA: Insufficient documentation

## 2023-09-21 MED ORDER — ESCITALOPRAM 20 MG TABLET
20.0000 mg | ORAL_TABLET | Freq: Every day | ORAL | 0 refills | Status: DC
Start: 2023-09-21 — End: 2023-10-19

## 2023-09-21 MED ORDER — RISPERIDONE 1 MG TABLET
1.0000 mg | ORAL_TABLET | Freq: Every evening | ORAL | 0 refills | Status: DC
Start: 2023-09-21 — End: 2023-10-19

## 2023-09-21 MED ORDER — LAMOTRIGINE 100 MG TABLET
100.0000 mg | ORAL_TABLET | Freq: Two times a day (BID) | ORAL | 0 refills | Status: DC
Start: 2023-09-21 — End: 2023-12-08

## 2023-09-21 MED ORDER — LORAZEPAM 1 MG TABLET
1.0000 mg | ORAL_TABLET | Freq: Two times a day (BID) | ORAL | 0 refills | Status: DC | PRN
Start: 2023-09-21 — End: 2023-10-19

## 2023-09-21 NOTE — Patient Instructions (Addendum)
Take medications only as prescribed. Keep all medical appointments to avoid care interruption. Follow up with your primary care provider (PCP) regularly and obtain blood labs annually.     Your Medications have changed: Stop Wellbutrin, restart Lexapro, increase risperidone back to 1mg , continue lorazepam, continue Lamictal 100 mg by mouth bid. Follow up in one month.    What to do when medication does not work:...  Wait! Wait! Wait!  Taken lining is nighttime insomnia take at night if daytime sedation with you we or month before switching to another agent or adding other medications   Avoid mixing medications with drugs and alcohol.     Call the [facility] office if symptoms worsen or problems arise.  [phone].    *NOTE: It is best to consult your mental health provider before stopping medications.   When stopping mental health medications it is best to taper the medication to avoid withdrawal effects: dizziness, nausea, stomach cramps, sweating, tingling, or flu-like withdrawal symptoms, that  emerge during discontinuation. If these symptoms occur, residual or restart the medication to stop symptoms and then restart to taper down much more slowly.      Withdrawal symptoms can be more common or more severe with Effexor than some other antidepressants.  Abrupt discontinuation of fluoxetine/ Prozac because of the long half life, generally does not cause withdrawal symptoms.There is no need to taper stimulants, modafinil, buspirone, or hydroxyzine.

## 2023-09-21 NOTE — Progress Notes (Signed)
Progress Note    Patient's Full Name: Latoya Harrison   Patient's Date of Birth: 1976-07-15   Patient's Age: 47 y.o.   Patient's Legal Sex: female   Patient's MRN: [id number]     Current Date: 09/21/2023 15:26     CC: "  I need my meds adjusted"     History of Present Illness:Latoya Harrison 47 y.o. White female presents for medication management. Last appointment we increased bupropion 100 mg by mouth daily, increased number of tablets for Ativan 1mg  bid, continue Lamictal 100 mg , continue risperidone 0.5mg  by mouth daily.     Today client relays "I am just not doing as well on Wellbutrin, it has made my anxiety worse." States she would rather have the weight gain than to have the irritability that the Wellbutrin caused.    Denies suicidal, homicidal and self-harm thoughts or intentions. [name]  denies auditory, visual, tactile hallucinations, no delusions, no paranoia.  Reports sleep as ok, appetite more than desired and mood as less stable.  Denies adverse reactions to medications. Verbal consent to treat given. Reviewed treatment plan with client.  Denies questions or further concerns at this time.  Advised to contact the [facility] as needed for appointments, concerns, questions, or medication refills.     Vitals:    09/21/23 1510   BP: 126/65   Pulse: 82   Resp: 18   Weight: 89.5 kg (197 lb 6.4 oz)   Height: 1.6 m (5\' 3" )   BMI: 34.97     During this visit we completed the PHQ9 (19 ); GAD7( 16 )    Plan: Treatment and Medication Recommendation: Stop Wellbutrin, restart Lexapro, increase risperidone back to 1mg , continue lorazepam, continue Lamictal 100 mg by mouth bid. Follow up in one month.Medications will be prescribed and adjusted as indicated by reported symptoms and medication efficacy.    (F31.81) Bipolar II disorder (CMS HCC)  (primary encounter diagnosis)    (F32.A) Depression, unspecified depression type    (F41.1) GAD (generalized anxiety disorder)    (F33.1) Moderate  episode of recurrent major depressive disorder (CMS HCC)    (F42.9) Obsessive-compulsive disorder, unspecified type    (F41.0) Panic attacks    (F43.10) PTSD (post-traumatic stress disorder)     Medication Education: Advised of medication AE risks and benefits. Advised to contact provider with complaints, reports, questions, needs, or concerns. Discussed importance of taking medication only as prescribed and keeping all medical and mental health appointments.    We have discussed medication adverse-effects, treatment options, and pregnancy precautions if applicable.  The patient verbalizes agreement with this plan. Client agrees with the plan and will be encouraged to contact provider with questions and concerns. Patient has been instructed follow up plan. Further changes will be dictated by clinical course.      [name], APRN,PMHNP-BC  [facility]

## 2023-09-29 ENCOUNTER — Ambulatory Visit: Payer: BC Managed Care – PPO | Admitting: NURSE PRACTITIONER

## 2023-10-06 ENCOUNTER — Telehealth (INDEPENDENT_AMBULATORY_CARE_PROVIDER_SITE_OTHER): Payer: Self-pay | Admitting: PHYSICIAN ASSISTANT

## 2023-10-06 ENCOUNTER — Other Ambulatory Visit (INDEPENDENT_AMBULATORY_CARE_PROVIDER_SITE_OTHER): Payer: Self-pay | Admitting: PHYSICIAN ASSISTANT

## 2023-10-06 MED ORDER — PROPRANOLOL ER 120 MG CAPSULE,24 HR,EXTENDED RELEASE
120.0000 mg | ORAL_CAPSULE | Freq: Every day | ORAL | 0 refills | Status: DC
Start: 2023-10-06 — End: 2023-12-08

## 2023-10-06 NOTE — Telephone Encounter (Signed)
Patient informed. Orders placed.

## 2023-10-06 NOTE — Nursing Note (Signed)
Received call from patient stating that she had seen Ariel and had her Propanolol refilled but the wrong dose was filled. States that 80 mg was sent instead of the 120mg  and asked if the 120mg  could be sent in to her pharmacy.

## 2023-10-12 ENCOUNTER — Ambulatory Visit (INDEPENDENT_AMBULATORY_CARE_PROVIDER_SITE_OTHER): Payer: Self-pay | Admitting: Surgery

## 2023-10-19 ENCOUNTER — Other Ambulatory Visit: Payer: Self-pay

## 2023-10-19 ENCOUNTER — Ambulatory Visit: Payer: BC Managed Care – PPO | Attending: NURSE PRACTITIONER | Admitting: NURSE PRACTITIONER

## 2023-10-19 ENCOUNTER — Encounter (HOSPITAL_PSYCHIATRIC): Payer: Self-pay | Admitting: NURSE PRACTITIONER

## 2023-10-19 VITALS — BP 106/73 | HR 87 | Resp 18 | Ht 63.0 in | Wt 199.2 lb

## 2023-10-19 DIAGNOSIS — F4310 Post-traumatic stress disorder, unspecified: Secondary | ICD-10-CM | POA: Insufficient documentation

## 2023-10-19 DIAGNOSIS — F429 Obsessive-compulsive disorder, unspecified: Secondary | ICD-10-CM | POA: Insufficient documentation

## 2023-10-19 DIAGNOSIS — F411 Generalized anxiety disorder: Secondary | ICD-10-CM | POA: Insufficient documentation

## 2023-10-19 DIAGNOSIS — F32A Depression, unspecified: Secondary | ICD-10-CM | POA: Insufficient documentation

## 2023-10-19 DIAGNOSIS — F3181 Bipolar II disorder: Secondary | ICD-10-CM | POA: Insufficient documentation

## 2023-10-19 DIAGNOSIS — F410 Panic disorder [episodic paroxysmal anxiety] without agoraphobia: Secondary | ICD-10-CM | POA: Insufficient documentation

## 2023-10-19 DIAGNOSIS — F331 Major depressive disorder, recurrent, moderate: Secondary | ICD-10-CM | POA: Insufficient documentation

## 2023-10-19 MED ORDER — RISPERIDONE 1 MG TABLET
1.0000 mg | ORAL_TABLET | Freq: Every evening | ORAL | 0 refills | Status: AC
Start: 2023-10-19 — End: 2024-01-24

## 2023-10-19 MED ORDER — LORAZEPAM 1 MG TABLET
1.0000 mg | ORAL_TABLET | Freq: Two times a day (BID) | ORAL | 0 refills | Status: DC | PRN
Start: 2023-10-19 — End: 2024-01-29

## 2023-10-19 MED ORDER — ESCITALOPRAM 20 MG TABLET
20.0000 mg | ORAL_TABLET | Freq: Every day | ORAL | 2 refills | Status: DC
Start: 2023-10-19 — End: 2023-12-08

## 2023-10-19 NOTE — Progress Notes (Signed)
Progress Note    Patient's Full Name: Latoya Harrison   Patient's Date of Birth: September 19, 1976   Patient's Age: 48 y.o.   Patient's Legal Sex: female   Patient's MRN: [id number]     Current Date: 10/19/2023 10:06     CC: "  doing better"     History of Present Illness:Latoya Harrison 48 y.o. White female presents for medication management. Last appointment we Stop Wellbutrin, restart Lexapro, increase risperidone back to 1mg , continue lorazepam, continue Lamictal 100 mg by mouth bid.     Today client relays " I do feel better, not to say that it will continue to work, something will work for a while but then it stops working,""    Denies suicidal, homicidal and self-harm thoughts or intentions. Latoya Harrison  denies auditory, visual, tactile hallucinations, no delusions, no paranoia. Rapid spech, flight of ideas, high energy noted today. Reports sleep as good, appetite adequate and mood as stable. . Denies adverse reactions to medications. Verbal consent to treat given. Reviewed treatment plan with client.  Denies questions or further concerns at this time.  Advised to contact the [facility] as needed for appointments, concerns, questions, or medication refills.    Vitals:    10/19/23 0957   BP: 106/73   Pulse: 87   Resp: 18   Weight: 90.4 kg (199 lb 3.2 oz)   Height: 1.6 m (5\' 3" )   BMI: 35.29     During this visit we completed the PHQ9 ( 13);    Plan: Treatment and Medication Recommendation:  Medications will be prescribed and adjusted as indicated by reported symptoms and medication efficacy. Advised and educated against using alcohol, illicit substances, cannabis, or misusing prescription medications while on psychotropic medication due to risk of negative interactions. Client voiced understanding that combining alcohol, illicit substances, cannabis, or misusing prescription medications will be at his/her own risk.    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 Advised to contact provider with complaints, reports, questions, needs, or concerns. Discussed importance of taking medication only as prescribed and keeping all medical and mental health appointments.    We have discussed medication adverse-effects, treatment options, and pregnancy precautions if applicable.  The patient verbalizes agreement with this plan. Client agrees with the plan and will be encouraged to contact provider with questions and concerns. Patient has been instructed follow up plan. Further changes will be dictated by clinical course.    Latoya Sales, APRN,PMHNP-BC  [facility]

## 2023-10-19 NOTE — Patient Instructions (Addendum)
 Take medications only as prescribed. Keep all medical appointments to avoid care interruption. Follow up with your primary care provider (PCP) regularly and obtain blood labs annually.     What to do when medication does not work:...  Wait! Wait! Wait!  Taken lining is nighttime insomnia take at night if daytime sedation with you we or month before switching to another agent or adding other medications   Avoid mixing medications with drugs and alcohol.     Call the [facility] office if symptoms worsen or problems arise.  [phone].    *NOTE: It is best to consult your mental health provider before stopping medications.   When stopping mental health medications it is best to taper the medication to avoid withdrawal effects: dizziness, nausea, stomach cramps, sweating, tingling, or flu-like withdrawal symptoms, that  emerge during discontinuation. If these symptoms occur, residual or restart the medication to stop symptoms and then restart to taper down much more slowly.      Withdrawal symptoms can be more common or more severe with Effexor than some other antidepressants.  Abrupt discontinuation of fluoxetine/ Prozac because of the long half life, generally does not cause withdrawal symptoms.There is no need to taper stimulants, modafinil, buspirone, or hydroxyzine. Advised and educated against using alcohol, illicit substances, cannabis, or misusing prescription medications while on psychotropic medication due to risk of negative interactions. Client voiced understanding that combining alcohol, illicit substances, cannabis, or misusing prescription medications will be at his/her own risk.

## 2023-10-31 ENCOUNTER — Ambulatory Visit (INDEPENDENT_AMBULATORY_CARE_PROVIDER_SITE_OTHER): Payer: BC Managed Care – PPO | Admitting: Surgery

## 2023-12-08 ENCOUNTER — Other Ambulatory Visit: Attending: PHYSICIAN ASSISTANT | Admitting: PHYSICIAN ASSISTANT

## 2023-12-08 ENCOUNTER — Ambulatory Visit (INDEPENDENT_AMBULATORY_CARE_PROVIDER_SITE_OTHER): Payer: Self-pay | Admitting: PHYSICIAN ASSISTANT

## 2023-12-08 ENCOUNTER — Encounter (INDEPENDENT_AMBULATORY_CARE_PROVIDER_SITE_OTHER): Payer: Self-pay | Admitting: PHYSICIAN ASSISTANT

## 2023-12-08 ENCOUNTER — Other Ambulatory Visit: Payer: Self-pay

## 2023-12-08 VITALS — BP 135/67 | HR 83 | Temp 98.1°F | Resp 16 | Ht 63.0 in | Wt 200.0 lb

## 2023-12-08 DIAGNOSIS — E559 Vitamin D deficiency, unspecified: Secondary | ICD-10-CM | POA: Insufficient documentation

## 2023-12-08 DIAGNOSIS — R519 Headache, unspecified: Secondary | ICD-10-CM | POA: Insufficient documentation

## 2023-12-08 DIAGNOSIS — K909 Intestinal malabsorption, unspecified: Secondary | ICD-10-CM | POA: Insufficient documentation

## 2023-12-08 DIAGNOSIS — G8929 Other chronic pain: Secondary | ICD-10-CM

## 2023-12-08 DIAGNOSIS — E538 Deficiency of other specified B group vitamins: Secondary | ICD-10-CM

## 2023-12-08 DIAGNOSIS — K219 Gastro-esophageal reflux disease without esophagitis: Secondary | ICD-10-CM

## 2023-12-08 DIAGNOSIS — R7303 Prediabetes: Secondary | ICD-10-CM | POA: Insufficient documentation

## 2023-12-08 DIAGNOSIS — R635 Abnormal weight gain: Secondary | ICD-10-CM | POA: Insufficient documentation

## 2023-12-08 DIAGNOSIS — L732 Hidradenitis suppurativa: Secondary | ICD-10-CM

## 2023-12-08 LAB — BASIC METABOLIC PANEL
ANION GAP: 11 mmol/L (ref 4–13)
BUN/CREA RATIO: 13 (ref 6–22)
BUN: 12 mg/dL (ref 7–25)
CALCIUM: 9.8 mg/dL (ref 8.6–10.3)
CHLORIDE: 101 mmol/L (ref 98–107)
CO2 TOTAL: 24 mmol/L (ref 21–31)
CREATININE: 0.95 mg/dL (ref 0.60–1.30)
ESTIMATED GFR: 75 mL/min/{1.73_m2} (ref >59)
GLUCOSE: 114 mg/dL — ABNORMAL HIGH (ref 74–109)
OSMOLALITY, CALCULATED: 273 mosm/kg (ref 270–290)
POTASSIUM: 4 mmol/L (ref 3.5–5.1)
SODIUM: 136 mmol/L (ref 136–145)

## 2023-12-08 LAB — LIPID PANEL
CHOL/HDL RATIO: 5.6
CHOLESTEROL: 208 mg/dL — ABNORMAL HIGH (ref <200)
HDL CHOL: 37 mg/dL — ABNORMAL LOW (ref >=40)
LDL CALC: 111 mg/dL — ABNORMAL HIGH (ref 0–100)
TRIGLYCERIDES: 299 mg/dL — ABNORMAL HIGH (ref <=150)
VLDL CALC: 60 mg/dL — ABNORMAL HIGH (ref 0–50)

## 2023-12-08 LAB — HEPATIC FUNCTION PANEL
ALBUMIN/GLOBULIN RATIO: 1.4 (ref 0.8–1.4)
ALBUMIN: 4.5 g/dL (ref 3.5–5.7)
ALKALINE PHOSPHATASE: 98 U/L (ref 34–104)
ALT (SGPT): 27 U/L (ref 7–52)
AST (SGOT): 18 U/L (ref 13–39)
BILIRUBIN DIRECT: 0.06 md/dL (ref 0.03–0.18)
BILIRUBIN TOTAL: 0.5 mg/dL (ref 0.3–1.0)
BILIRUBIN, INDIRECT: 0.44 mg/dL (ref <=1)
GLOBULIN: 3.3 (ref 2.0–3.5)
PROTEIN TOTAL: 7.8 g/dL (ref 6.4–8.9)

## 2023-12-08 LAB — VITAMIN D 25 TOTAL: VITAMIN D 25, TOTAL: 28.97 ng/mL — ABNORMAL LOW (ref 30.00–100.00)

## 2023-12-08 LAB — HGA1C (HEMOGLOBIN A1C WITH EST AVG GLUCOSE): HEMOGLOBIN A1C: 6.8 % — ABNORMAL HIGH (ref 4.0–6.0)

## 2023-12-08 LAB — MAGNESIUM: MAGNESIUM: 2.1 mg/dL (ref 1.9–2.7)

## 2023-12-08 LAB — THYROID STIMULATING HORMONE (SENSITIVE TSH): TSH: 1.355 u[IU]/mL (ref 0.450–5.330)

## 2023-12-08 MED ORDER — MECLIZINE 25 MG TABLET
25.0000 mg | ORAL_TABLET | Freq: Three times a day (TID) | ORAL | 0 refills | Status: DC | PRN
Start: 2023-12-08 — End: 2024-05-29

## 2023-12-08 MED ORDER — LAMOTRIGINE 100 MG TABLET
100.0000 mg | ORAL_TABLET | Freq: Two times a day (BID) | ORAL | 0 refills | Status: DC
Start: 2023-12-08 — End: 2024-03-13

## 2023-12-08 MED ORDER — PROPRANOLOL ER 120 MG CAPSULE,24 HR,EXTENDED RELEASE
120.0000 mg | ORAL_CAPSULE | Freq: Every day | ORAL | 0 refills | Status: DC
Start: 2023-12-08 — End: 2024-05-29

## 2023-12-08 MED ORDER — ESCITALOPRAM 20 MG TABLET
20.0000 mg | ORAL_TABLET | Freq: Every day | ORAL | 2 refills | Status: DC
Start: 2023-12-08 — End: 2024-03-13

## 2023-12-08 MED ORDER — TETRACYCLINE 500 MG CAPSULE
500.0000 mg | ORAL_CAPSULE | Freq: Two times a day (BID) | ORAL | 0 refills | Status: DC
Start: 2023-12-08 — End: 2024-01-24

## 2023-12-08 MED ORDER — OMEPRAZOLE 40 MG CAPSULE,DELAYED RELEASE
40.0000 mg | DELAYED_RELEASE_CAPSULE | Freq: Every day | ORAL | 0 refills | Status: DC
Start: 2023-12-08 — End: 2024-03-04

## 2023-12-08 MED ORDER — CYANOCOBALAMIN (VIT B-12) 1,000 MCG/ML INJECTION SOLUTION
1000.0000 ug | INTRAMUSCULAR | 0 refills | Status: DC
Start: 2023-12-08 — End: 2024-05-29

## 2023-12-08 MED ORDER — ERGOCALCIFEROL (VITAMIN D2) 1,250 MCG (50,000 UNIT) CAPSULE
50000.0000 [IU] | ORAL_CAPSULE | ORAL | 0 refills | Status: DC
Start: 2023-12-08 — End: 2024-03-04

## 2023-12-08 MED ORDER — MAGNESIUM OXIDE 400 MG (241.3 MG MAGNESIUM) TABLET
400.0000 mg | ORAL_TABLET | Freq: Every day | ORAL | 0 refills | Status: DC
Start: 2023-12-08 — End: 2024-04-09

## 2023-12-08 MED ORDER — FAMOTIDINE 40 MG TABLET
40.0000 mg | ORAL_TABLET | Freq: Two times a day (BID) | ORAL | 0 refills | Status: DC
Start: 2023-12-08 — End: 2024-03-04

## 2023-12-08 NOTE — Assessment & Plan Note (Addendum)
 Orders:    famotidine (PEPCID) 40 mg Oral Tablet; Take 1 Tablet (40 mg total) by mouth Twice daily for 90 days    omeprazole (PRILOSEC) 40 mg Oral Capsule, Delayed Release(E.C.); Take 1 Capsule (40 mg total) by mouth Once a day for 90 days    Referral to External Provider

## 2023-12-08 NOTE — Assessment & Plan Note (Addendum)
 Check labs and refill meds  Orders:    ergocalciferol, vitamin D2, (DRISDOL) 1,250 mcg (50,000 unit) Oral Capsule; Take 1 Capsule (50,000 Units total) by mouth Every 7 days for 90 days    VITAMIN D 25 TOTAL; Future

## 2023-12-08 NOTE — Assessment & Plan Note (Signed)
 Continue meds and refill today  Orders:    THYROID STIMULATING HORMONE (SENSITIVE TSH); Future

## 2023-12-08 NOTE — Assessment & Plan Note (Addendum)
 Check labs and determine dose level  Orders:    magnesium oxide (MAG-OX) 400 mg Oral Tablet; Take 1 Tablet (400 mg total) by mouth Once a day for 90 days    MAGNESIUM; Future
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
## 2023-12-08 NOTE — Progress Notes (Signed)
 FAMILY MEDICINE, MEDICAL OFFICE BUILDING  [address] [place] [address]      Progress Note    Latoya Harrison  03/07/1976  [id number]    Date of Service: 12/08/2023  9:00 AM EST    Chief complaint:   Chief Complaint   Patient presents with    Follow Up     Patient is here for CDM with medication refills.     Labs Only       Subjective:     This is a case of a 48 y.o. year old female who comes in today for follow up on her chronic medical conditions and med  refills. Pt.scheduled to see Dr.Barker next week for her annual mammogram. Pt.was seeing Dr.Brodnick and needs new gynecologist. Pt.c/o areas on her legs that are increasing with HS. Pt.has to reschedule her appt.with Dr.Duremedes due to bad weather. Pt.concerned over weight gain also. [facility]      Review of Systems   Constitutional:  Positive for fatigue and unexpected weight change.   Respiratory:  Negative for cough, shortness of breath and wheezing.    Cardiovascular:  Negative for chest pain and leg swelling.   Gastrointestinal:  Negative for abdominal pain, diarrhea, nausea and vomiting.        Pt.states the increase in Pepcid from last visit has helped control her sx.She still knows she needs to r/s with Dr.Duremedes  for EGD and colonoscopy   Skin:  Positive for color change.        Pt.states she's been dx with hidradenitis suppurative by dermatologist in the past. She's noticed more break out areas on her upper and inner thigh areas.   Psychiatric/Behavioral:          Pt.follows with [facility] for her anxiety tx on regular basis.       Current Outpatient Medications   Medication Sig    cyanocobalamin (VITAMIN B12) 1,000 mcg/mL Injection Solution Inject 1 mL (1,000 mcg total) under the skin Every 7 days for 90 days    ergocalciferol, vitamin D2, (DRISDOL) 1,250 mcg (50,000 unit) Oral Capsule Take 1 Capsule (50,000 Units total) by mouth Every 7 days for 90 days    escitalopram oxalate (LEXAPRO) 20 mg  Oral Tablet Take 1 Tablet (20 mg total) by mouth Once a day for 90 days Indications: bipolar depression    famotidine (PEPCID) 40 mg Oral Tablet Take 1 Tablet (40 mg total) by mouth Twice daily for 90 days    lamoTRIgine (LAMICTAL) 100 mg Oral Tablet Take 1 Tablet (100 mg total) by mouth Twice daily for 90 days Indications: bipolar depression    LORazepam (ATIVAN) 1 mg Oral Tablet Take 1 Tablet (1 mg total) by mouth Twice per day as needed for Anxiety for up to 90 days Indications: anxious    magnesium oxide (MAG-OX) 400 mg Oral Tablet Take 1 Tablet (400 mg total) by mouth Once a day for 90 days    meclizine (ANTIVERT) 25 mg Oral Tablet Take 1 Tablet (25 mg total) by mouth Every 8 hours as needed    omeprazole (PRILOSEC) 40 mg Oral Capsule, Delayed Release(E.C.) Take 1 Capsule (40 mg total) by mouth Once a day for 90 days    propranoloL (INDERAL LA) 120 mg Oral Capsule,Sustained Action 24 hr Take 1 Capsule (120 mg total) by mouth Once a day for 90 days    risperiDONE (RISPERDAL) 1 mg Oral Tablet Take 1 Tablet (1 mg total)  by mouth Every night for 90 days Indications: bipolar depression    Tetracycline (SUMYCIN) 500 mg Oral Capsule Take 1 Capsule (500 mg total) by mouth Twice daily for 90 days       Objective:     BP 135/67 (Site: Left Arm, Patient Position: Sitting, Cuff Size: Adult)   Pulse 83   Temp 36.7 C (98.1 F) (Temporal)   Resp 16   Ht 1.6 m (5\' 3" )   Wt 90.7 kg (200 lb)   SpO2 95%   BMI 35.43 kg/m      Physical Exam  Vitals and nursing note reviewed.   Constitutional:       General: She is not in acute distress.     Appearance: Normal appearance. She is normal weight.   HENT:      Head: Normocephalic and atraumatic.      Right Ear: Tympanic membrane normal.      Left Ear: Tympanic membrane normal.      Nose: Nose normal.   Eyes:      Extraocular Movements: Extraocular movements intact.      Conjunctiva/sclera: Conjunctivae normal.   Neck:      Vascular: No carotid bruit.   Cardiovascular:      Rate  and Rhythm: Normal rate and regular rhythm.      Pulses: Normal pulses.      Heart sounds: Normal heart sounds. No murmur heard.  Pulmonary:      Effort: Pulmonary effort is normal.      Breath sounds: Normal breath sounds.   Chest:      Chest wall: No mass, lacerations or tenderness.   Breasts:     Right: Normal. No swelling, bleeding, inverted nipple, mass, nipple discharge, skin change or tenderness.      Left: Normal. No swelling, bleeding, inverted nipple, mass, nipple discharge, skin change or tenderness.   Abdominal:      General: Abdomen is flat. Bowel sounds are normal.      Palpations: Abdomen is soft.      Tenderness: There is no abdominal tenderness. There is no guarding.   Genitourinary:     General: Normal vulva.      Vagina: No vaginal discharge.      Comments: deferred  Musculoskeletal:         General: No swelling or deformity. Normal range of motion.      Cervical back: Normal range of motion and neck supple. No tenderness.   Lymphadenopathy:      Cervical: No cervical adenopathy.      Upper Body:      Right upper body: No supraclavicular, axillary or pectoral adenopathy.      Left upper body: No supraclavicular, axillary or pectoral adenopathy.   Skin:     General: Skin is warm and dry.      Findings: Lesion present.      Comments: Pts.anterior thigh areas and inner thigh areas with lump like areas under the skin with discoloration, 2 lesions look inflamed on her left thigh   Neurological:      General: No focal deficit present.      Mental Status: She is alert. Mental status is at baseline.   Psychiatric:         Mood and Affect: Mood normal.         Behavior: Behavior normal.         Thought Content: Thought content normal.         Assessment & Plan  B12 deficiency    Orders:    cyanocobalamin (VITAMIN B12) 1,000 mcg/mL Injection Solution; Inject 1 mL (1,000 mcg total) under the skin Every 7 days for 90 days    Vitamin D deficiency  Check labs and refill meds  Orders:    ergocalciferol, vitamin  D2, (DRISDOL) 1,250 mcg (50,000 unit) Oral Capsule; Take 1 Capsule (50,000 Units total) by mouth Every 7 days for 90 days    VITAMIN D 25 TOTAL; Future    Gastroesophageal reflux disease, unspecified whether esophagitis present    Orders:    famotidine (PEPCID) 40 mg Oral Tablet; Take 1 Tablet (40 mg total) by mouth Twice daily for 90 days    omeprazole (PRILOSEC) 40 mg Oral Capsule, Delayed Release(E.C.); Take 1 Capsule (40 mg total) by mouth Once a day for 90 days    Referral to External Provider    Hypomagnesemia  Check labs and determine dose level  Orders:    magnesium oxide (MAG-OX) 400 mg Oral Tablet; Take 1 Tablet (400 mg total) by mouth Once a day for 90 days    MAGNESIUM; Future    Hidradenitis suppurativa  TCN 500 mg 1 bid -discussed photosensitivity side effect with pt.  Orders:    HGA1C (HEMOGLOBIN A1C WITH EST AVG GLUCOSE); Future    INSULIN, SERUM; Future    Pre-diabetes    Orders:    HGA1C (HEMOGLOBIN A1C WITH EST AVG GLUCOSE); Future    INSULIN, SERUM; Future    BASIC METABOLIC PANEL; Future    HEPATIC FUNCTION PANEL; Future    LIPID PANEL; Future    Chronic headaches  Continue meds and refill today  Orders:    THYROID STIMULATING HORMONE (SENSITIVE TSH); Future    Weight gain  Check labs and determine treatment plan after labs reviewed  Orders:    HGA1C (HEMOGLOBIN A1C WITH EST AVG GLUCOSE); Future    THYROID STIMULATING HORMONE (SENSITIVE TSH); Future    INSULIN, SERUM; Future       Other orders    escitalopram oxalate (LEXAPRO) 20 mg Oral Tablet; Take 1 Tablet (20 mg total) by mouth Once a day for 90 days Indications: bipolar depression    lamoTRIgine (LAMICTAL) 100 mg Oral Tablet; Take 1 Tablet (100 mg total) by mouth Twice daily for 90 days Indications: bipolar depression    propranoloL (INDERAL LA) 120 mg Oral Capsule,Sustained Action 24 hr; Take 1 Capsule (120 mg total) by mouth Once a day for 90 days    meclizine (ANTIVERT) 25 mg Oral Tablet; Take 1 Tablet (25 mg total) by mouth Every 8  hours as needed    Tetracycline (SUMYCIN) 500 mg Oral Capsule; Take 1 Capsule (500 mg total) by mouth Twice daily for 90 days          The patient was given the opportunity to ask questions and those questions were answered to the patient's satisfaction. The patient was encouraged to call with any additional questions or concerns.    Follow up: Return in about 6 weeks (around 01/19/2024).    Dewayne Shorter, PA-C

## 2023-12-11 ENCOUNTER — Ambulatory Visit (INDEPENDENT_AMBULATORY_CARE_PROVIDER_SITE_OTHER): Payer: Self-pay | Admitting: PHYSICIAN ASSISTANT

## 2023-12-11 DIAGNOSIS — E119 Type 2 diabetes mellitus without complications: Secondary | ICD-10-CM

## 2023-12-11 LAB — INSULIN, SERUM: INSULIN: 29.7 m[IU]/mL (ref 3.2–32.1)

## 2023-12-11 MED ORDER — BLOOD-GLUCOSE METER
0 refills | Status: AC
Start: 2023-12-11 — End: 2023-12-12

## 2023-12-11 MED ORDER — CHOLECALCIFEROL (VITAMIN D3) 25 MCG (1,000 UNIT) TABLET
1000.0000 [IU] | ORAL_TABLET | Freq: Every day | ORAL | Status: DC
Start: 2023-12-11 — End: 2024-04-03

## 2023-12-11 MED ORDER — LANCETS 26 GAUGE
1 refills | Status: AC
Start: 2023-12-11 — End: ?

## 2023-12-11 MED ORDER — ATORVASTATIN 10 MG TABLET
10.0000 mg | ORAL_TABLET | Freq: Every day | ORAL | 0 refills | Status: DC
Start: 2023-12-11 — End: 2024-03-04

## 2023-12-11 MED ORDER — BLOOD GLUCOSE TEST STRIPS
ORAL_STRIP | 1 refills | Status: AC
Start: 2023-12-11 — End: ?

## 2023-12-11 MED ORDER — METFORMIN ER 500 MG TABLET,EXTENDED RELEASE 24 HR
500.0000 mg | ORAL_TABLET | Freq: Every day | ORAL | 1 refills | Status: DC
Start: 2023-12-11 — End: 2024-04-17

## 2023-12-18 ENCOUNTER — Ambulatory Visit (HOSPITAL_PSYCHIATRIC): Payer: Self-pay | Admitting: NURSE PRACTITIONER

## 2024-01-01 ENCOUNTER — Ambulatory Visit (HOSPITAL_PSYCHIATRIC): Payer: Self-pay | Admitting: NURSE PRACTITIONER

## 2024-01-03 ENCOUNTER — Encounter (INDEPENDENT_AMBULATORY_CARE_PROVIDER_SITE_OTHER): Payer: Self-pay | Admitting: PHYSICIAN ASSISTANT

## 2024-01-03 ENCOUNTER — Telehealth (INDEPENDENT_AMBULATORY_CARE_PROVIDER_SITE_OTHER): Payer: Self-pay | Admitting: PHYSICIAN ASSISTANT

## 2024-01-03 NOTE — Telephone Encounter (Signed)
 Letter mailed

## 2024-01-03 NOTE — Telephone Encounter (Signed)
-----   Message from Mills Alma, PA-C sent at 01/03/2024 12:50 PM EDT -----  Regarding: mammo  Mammogram WNL  Recheck mammogram in 1 year

## 2024-01-04 ENCOUNTER — Other Ambulatory Visit (INDEPENDENT_AMBULATORY_CARE_PROVIDER_SITE_OTHER): Payer: Self-pay

## 2024-01-24 ENCOUNTER — Encounter (INDEPENDENT_AMBULATORY_CARE_PROVIDER_SITE_OTHER): Payer: Self-pay | Admitting: PHYSICIAN ASSISTANT

## 2024-01-24 ENCOUNTER — Other Ambulatory Visit: Payer: Self-pay

## 2024-01-24 ENCOUNTER — Ambulatory Visit: Payer: Self-pay | Attending: PHYSICIAN ASSISTANT | Admitting: PHYSICIAN ASSISTANT

## 2024-01-24 VITALS — BP 124/67 | HR 70 | Temp 98.1°F | Resp 16 | Ht 63.0 in | Wt 197.0 lb

## 2024-01-24 DIAGNOSIS — E785 Hyperlipidemia, unspecified: Secondary | ICD-10-CM | POA: Insufficient documentation

## 2024-01-24 DIAGNOSIS — L732 Hidradenitis suppurativa: Secondary | ICD-10-CM | POA: Insufficient documentation

## 2024-01-24 DIAGNOSIS — Z7985 Long-term (current) use of injectable non-insulin antidiabetic drugs: Secondary | ICD-10-CM | POA: Insufficient documentation

## 2024-01-24 DIAGNOSIS — E119 Type 2 diabetes mellitus without complications: Secondary | ICD-10-CM | POA: Insufficient documentation

## 2024-01-24 DIAGNOSIS — Z79899 Other long term (current) drug therapy: Secondary | ICD-10-CM | POA: Insufficient documentation

## 2024-01-24 DIAGNOSIS — Z1331 Encounter for screening for depression: Secondary | ICD-10-CM | POA: Insufficient documentation

## 2024-01-24 DIAGNOSIS — Z7984 Long term (current) use of oral hypoglycemic drugs: Secondary | ICD-10-CM | POA: Insufficient documentation

## 2024-01-24 MED ORDER — DOXYCYCLINE HYCLATE 100 MG CAPSULE
100.0000 mg | ORAL_CAPSULE | Freq: Two times a day (BID) | ORAL | 0 refills | Status: AC
Start: 2024-01-24 — End: 2024-02-03

## 2024-01-24 MED ORDER — MOUNJARO 2.5 MG/0.5 ML SUBCUTANEOUS PEN INJECTOR
2.5000 mg | PEN_INJECTOR | SUBCUTANEOUS | 0 refills | Status: DC
Start: 2024-01-24 — End: 2024-01-29

## 2024-01-24 NOTE — Progress Notes (Signed)
 FAMILY MEDICINE, MEDICAL OFFICE BUILDING  [address]  [place] [address]  Operated by [facility]    Progress Note    Latoya Harrison  [birth year]  [id number]    Date of Service: 01/24/2024  9:00 AM EDT    Chief complaint:   Chief Complaint   Patient presents with    Follow Up     Patient is here for CDM. She wishes to discuss ozempic and the antibiotic she was prescribed the last time.        Subjective:     This is a case of a 48 y.o. year old female who comes in today for follow up. Pt.unable to tolerate Doxycycline  due to bad diarrhea with meds. Pt.states she d/c meds after 1 week. Pt.states she wishes to proceed with Ozempic/Mounjaro . Pt.states she is feeling ok otherwise. Pt.states no other issues. She has her anxiety. Pts. FBS 120's and RBS less 70.         Current Outpatient Medications   Medication Sig    atorvastatin  (LIPITOR) 10 mg Oral Tablet Take 1 Tablet (10 mg total) by mouth Once a day    Blood Sugar Diagnostic (BLOOD GLUCOSE TEST) Strip Use as directed to check blood sugar twice daily and as needed.    cholecalciferol , vitamin D3, 25 mcg (1,000 unit) Oral Tablet Take 1 Tablet (1,000 Units total) by mouth Once a day    cyanocobalamin  (VITAMIN B12) 1,000 mcg/mL Injection Solution Inject 1 mL (1,000 mcg total) under the skin Every 7 days for 90 days    doxycycline  hyclate (VIBRAMYCIN ) 100 mg Oral Capsule Take 1 Capsule (100 mg total) by mouth Twice daily for 10 days    ergocalciferol , vitamin D2, (DRISDOL ) 1,250 mcg (50,000 unit) Oral Capsule Take 1 Capsule (50,000 Units total) by mouth Every 7 days for 90 days    escitalopram  oxalate (LEXAPRO ) 20 mg Oral Tablet Take 1 Tablet (20 mg total) by mouth Once a day for 90 days Indications: bipolar depression    famotidine  (PEPCID ) 40 mg Oral Tablet Take 1 Tablet (40 mg total) by mouth Twice daily for 90 days    lamoTRIgine  (LAMICTAL ) 100 mg Oral Tablet Take 1 Tablet (100 mg total) by mouth Twice daily for 90 days Indications:  bipolar depression    lancets (UNIVERSAL 1 LANCETS) 26 gauge Misc Fingersticks 2 times daily and as needed.    LORazepam  (ATIVAN ) 1 mg Oral Tablet Take 1 Tablet (1 mg total) by mouth Twice per day as needed for Anxiety for up to 90 days Indications: anxious    magnesium  oxide (MAG-OX) 400 mg Oral Tablet Take 1 Tablet (400 mg total) by mouth Once a day for 90 days    meclizine  (ANTIVERT ) 25 mg Oral Tablet Take 1 Tablet (25 mg total) by mouth Every 8 hours as needed    metFORMIN  (GLUCOPHAGE  XR) 500 mg Oral Tablet Sustained Release 24 hr Take 1 Tablet (500 mg total) by mouth Once a day    omeprazole  (PRILOSEC) 40 mg Oral Capsule, Delayed Release(E.C.) Take 1 Capsule (40 mg total) by mouth Once a day for 90 days    propranoloL  (INDERAL  LA) 120 mg Oral Capsule,Sustained Action 24 hr Take 1 Capsule (120 mg total) by mouth Once a day for 90 days    risperiDONE  (RISPERDAL ) 1 mg Oral Tablet Take 1 Tablet (1 mg total) by mouth Every night for 90 days Indications: bipolar depression    tirzepatide  (MOUNJARO ) 2.5 mg/0.5 mL Subcutaneous Pen Injector Inject  0.5 mL (2.5 mg total) under the skin Every 7 days       Objective:     BP 124/67 (Site: Left Arm, Patient Position: Sitting, Cuff Size: Adult)   Pulse 70   Temp 36.7 C (98.1 F) (Temporal)   Resp 16   Ht 1.6 m (5\' 3" )   Wt 89.4 kg (197 lb)   SpO2 95%   BMI 34.90 kg/m     BMI addressed: Advised on importance of nutrition to increase below normal BMI.      Physical Exam  Vitals and nursing note reviewed.   Constitutional:       General: She is not in acute distress.     Appearance: Normal appearance.   HENT:      Head: Normocephalic.   Eyes:      Extraocular Movements: Extraocular movements intact.      Conjunctiva/sclera: Conjunctivae normal.   Cardiovascular:      Rate and Rhythm: Normal rate and regular rhythm.      Heart sounds: Normal heart sounds.   Pulmonary:      Effort: Pulmonary effort is normal.      Breath sounds: Normal breath sounds.   Skin:     General:  Skin is warm and dry.   Neurological:      General: No focal deficit present.      Mental Status: She is alert. Mental status is at baseline.   Psychiatric:         Mood and Affect: Mood normal.         Behavior: Behavior normal.         Thought Content: Thought content normal.         Judgment: Judgment normal.           Assessment & Plan  DM type 2 (diabetes mellitus, type 2)  Continue record and monitor BS   Continue dietary changes and discussion  Refer nutrition  Discuss dietary changes.   Start Mounjaro -discuss side effects  Orders:    Referral to External Provider    Hyperlipidemia  Continue Lipitor as directed  Recheck labs in 3 months  Orders:    Referral to External Provider    Hidradenitis suppurativa  D/c TCN  Start Doxycycline  100 mg 1 bid--side effects discussed  Advise pt.if any GI side effects with meds call office to change meds            Other orders    tirzepatide  (MOUNJARO ) 2.5 mg/0.5 mL Subcutaneous Pen Injector; Inject 0.5 mL (2.5 mg total) under the skin Every 7 days    doxycycline  hyclate (VIBRAMYCIN ) 100 mg Oral Capsule; Take 1 Capsule (100 mg total) by mouth Twice daily for 10 days          The patient was given the opportunity to ask questions and those questions were answered to the patient's satisfaction. The patient was encouraged to call with any additional questions or concerns.    Follow up: Return in about 4 weeks (around 02/21/2024).    Mills Alma, PA-C

## 2024-01-24 NOTE — Nursing Note (Signed)
 01/24/24 0906   PHQ 9 (follow up)   Little interest or pleasure in doing things. 2   Feeling down, depressed, or hopeless 2   Trouble falling or staying asleep, or sleeping too much. 2   Feeling tired or having little energy 2   Poor appetite or overeating 2   Feeling bad about yourself/ that you are a failure in the past 2 weeks? 2   Trouble concentrating on things in the past 2 weeks? 2   Moving/Speaking slowly or being fidgety or restless  in the past 2 weeks? 2   Thoughts that you would be better off DEAD, or of hurting yourself in some way. 0   If you checked off any problems, how difficult have these problems made it for you to do your work, take care of things at home, or get along with other people? Very difficult   PHQ 9 Total 16   Interpretation of Total Score Moderate/Severe depression
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
## 2024-01-29 ENCOUNTER — Other Ambulatory Visit (HOSPITAL_PSYCHIATRIC): Payer: Self-pay | Admitting: Psychiatry

## 2024-01-29 ENCOUNTER — Other Ambulatory Visit (INDEPENDENT_AMBULATORY_CARE_PROVIDER_SITE_OTHER): Payer: Self-pay | Admitting: PHYSICIAN ASSISTANT

## 2024-01-29 MED ORDER — MOUNJARO 2.5 MG/0.5 ML SUBCUTANEOUS PEN INJECTOR
2.5000 mg | PEN_INJECTOR | SUBCUTANEOUS | 0 refills | Status: DC
Start: 2024-01-29 — End: 2024-04-17

## 2024-01-29 NOTE — Telephone Encounter (Signed)
 [facility] pharmacist called, and cannot fill this patients medication due to [place] [facility] DEA not working. So if you don't mind to approve or deny. Thank You

## 2024-01-30 MED ORDER — LORAZEPAM 1 MG TABLET
1.0000 mg | ORAL_TABLET | Freq: Two times a day (BID) | ORAL | 0 refills | Status: DC | PRN
Start: 2024-01-30 — End: 2024-03-04

## 2024-03-01 ENCOUNTER — Ambulatory Visit (INDEPENDENT_AMBULATORY_CARE_PROVIDER_SITE_OTHER): Payer: Self-pay | Admitting: PHYSICIAN ASSISTANT

## 2024-03-02 ENCOUNTER — Other Ambulatory Visit (HOSPITAL_PSYCHIATRIC): Payer: Self-pay | Admitting: Psychiatry

## 2024-03-02 ENCOUNTER — Other Ambulatory Visit (INDEPENDENT_AMBULATORY_CARE_PROVIDER_SITE_OTHER): Payer: Self-pay | Admitting: PHYSICIAN ASSISTANT

## 2024-03-02 DIAGNOSIS — K219 Gastro-esophageal reflux disease without esophagitis: Secondary | ICD-10-CM

## 2024-03-02 DIAGNOSIS — E559 Vitamin D deficiency, unspecified: Secondary | ICD-10-CM

## 2024-03-04 DIAGNOSIS — Z862 Personal history of diseases of the blood and blood-forming organs and certain disorders involving the immune mechanism: Secondary | ICD-10-CM | POA: Insufficient documentation

## 2024-03-04 DIAGNOSIS — L732 Hidradenitis suppurativa: Secondary | ICD-10-CM | POA: Insufficient documentation

## 2024-03-04 DIAGNOSIS — M5090 Cervical disc disorder, unspecified, unspecified cervical region: Secondary | ICD-10-CM | POA: Insufficient documentation

## 2024-03-04 NOTE — Telephone Encounter (Signed)
 Previous patient of  . Doesn't see Shel[facility]ens until 03/13/24 and needs a refill before then. If you don't mind to approve or deny. Thank You

## 2024-03-13 ENCOUNTER — Encounter (HOSPITAL_PSYCHIATRIC): Payer: Self-pay

## 2024-03-13 ENCOUNTER — Other Ambulatory Visit (HOSPITAL_PSYCHIATRIC): Payer: Self-pay | Admitting: Psychiatry

## 2024-03-13 ENCOUNTER — Ambulatory Visit: Payer: Self-pay

## 2024-03-13 ENCOUNTER — Other Ambulatory Visit: Payer: Self-pay

## 2024-03-13 VITALS — BP 127/80 | HR 90 | Resp 18 | Ht 63.0 in | Wt 190.0 lb

## 2024-03-13 DIAGNOSIS — Z814 Family history of other substance abuse and dependence: Secondary | ICD-10-CM

## 2024-03-13 DIAGNOSIS — F410 Panic disorder [episodic paroxysmal anxiety] without agoraphobia: Secondary | ICD-10-CM | POA: Insufficient documentation

## 2024-03-13 DIAGNOSIS — F429 Obsessive-compulsive disorder, unspecified: Secondary | ICD-10-CM | POA: Insufficient documentation

## 2024-03-13 DIAGNOSIS — F411 Generalized anxiety disorder: Secondary | ICD-10-CM | POA: Insufficient documentation

## 2024-03-13 DIAGNOSIS — F319 Bipolar disorder, unspecified: Secondary | ICD-10-CM

## 2024-03-13 DIAGNOSIS — F4310 Post-traumatic stress disorder, unspecified: Secondary | ICD-10-CM | POA: Insufficient documentation

## 2024-03-13 DIAGNOSIS — Z818 Family history of other mental and behavioral disorders: Secondary | ICD-10-CM

## 2024-03-13 DIAGNOSIS — F3181 Bipolar II disorder: Secondary | ICD-10-CM | POA: Insufficient documentation

## 2024-03-13 MED ORDER — LAMOTRIGINE 100 MG TABLET
100.0000 mg | ORAL_TABLET | Freq: Two times a day (BID) | ORAL | 0 refills | Status: DC
Start: 2024-03-13 — End: 2024-04-24

## 2024-03-13 MED ORDER — ESCITALOPRAM 20 MG TABLET
20.0000 mg | ORAL_TABLET | Freq: Every day | ORAL | 0 refills | Status: DC
Start: 2024-03-13 — End: 2024-04-24

## 2024-03-13 MED ORDER — RISPERIDONE 0.5 MG TABLET
0.5000 mg | ORAL_TABLET | Freq: Every day | ORAL | 0 refills | Status: DC
Start: 2024-03-13 — End: 2024-04-24

## 2024-03-13 MED ORDER — LORAZEPAM 1 MG TABLET
1.0000 mg | ORAL_TABLET | Freq: Two times a day (BID) | ORAL | 0 refills | Status: DC | PRN
Start: 2024-03-13 — End: 2024-04-24

## 2024-03-13 NOTE — Telephone Encounter (Signed)
 Pt seen by [facility] and has a follow-up appt 04/24/24. Prescriptions built per encounter note. Please review and approve. Thank you

## 2024-03-13 NOTE — Progress Notes (Addendum)
 BEHAVIORAL MEDICINE, [facility]  [address]  [place] [address]  Operated by [facility]    Name: Latoya Harrison MRN:  [id number]   Date: 03/13/2024 DOB:  1976-03-11 (48 y.o.)       Chief Complaint: Bipolar Disorder, Generalized Anxiety, Panic Disorder, PTSD, and OCD    Subjective:   Patient reports things are going ok. Has been taking puppies for the past several weeks which has been stressful. Going to the beach in July. Oldest daughter is moving to [place] this summer.  Mood Ok  Medications So far. No ill affects.  Appetite Good. Takes Mounjaro  for diabetes and weight loss. Started 1 week ago.  Sleep Poor sleep. Hasn't had Risperidone .  Energy low  Stressors None    Problem List[1]  Past Medical History[2]  Past Surgical History[3]  Family History[4]  Social History     Socioeconomic History    Marital status: Married   Tobacco Use    Smoking status: Every Day     Current packs/day: 1.00     Types: Cigarettes     Passive exposure: Never    Smokeless tobacco: Never   Vaping Use    Vaping status: Some Days    Substances: Nicotine, Flavoring   Substance and Sexual Activity    Alcohol use: Yes     Comment: occasionally    Drug use: Never      Levaquin [levofloxacin] and Pertussis vaccine,adsorbed   Current Outpatient Medications   Medication Sig    atorvastatin  (LIPITOR) 10 mg Oral Tablet Take 1 tablet by mouth once daily    Blood Sugar Diagnostic (BLOOD GLUCOSE TEST) Strip Use as directed to check blood sugar twice daily and as needed.    cholecalciferol , vitamin D3, 25 mcg (1,000 unit) Oral Tablet Take 1 Tablet (1,000 Units total) by mouth Once a day    ergocalciferol , vitamin D2, (DRISDOL ) 1,250 mcg (50,000 unit) Oral Capsule Take 1 capsule by mouth once a week    escitalopram  oxalate (LEXAPRO ) 20 mg Oral Tablet Take 1 Tablet (20 mg total) by mouth Once a day for 90 days Indications: bipolar depression    famotidine  (PEPCID ) 40 mg Oral Tablet Take 1  tablet by mouth twice daily    lamoTRIgine  (LAMICTAL ) 100 mg Oral Tablet Take 1 Tablet (100 mg total) by mouth Twice daily for 90 days Indications: bipolar depression    lancets (UNIVERSAL 1 LANCETS) 26 gauge Misc Fingersticks 2 times daily and as needed.    LORazepam  (ATIVAN ) 1 mg Oral Tablet Take 1 tablet by mouth twice daily as needed for anxiety    meclizine  (ANTIVERT ) 25 mg Oral Tablet Take 1 Tablet (25 mg total) by mouth Every 8 hours as needed    metFORMIN  (GLUCOPHAGE  XR) 500 mg Oral Tablet Sustained Release 24 hr Take 1 Tablet (500 mg total) by mouth Once a day    omeprazole  (PRILOSEC) 40 mg Oral Capsule, Delayed Release(E.C.) Take 1 capsule by mouth once daily    propranoloL  (INDERAL  LA) 120 mg Oral Capsule,Sustained Action 24 hr Take 1 Capsule (120 mg total) by mouth Once a day for 90 days    tirzepatide  (MOUNJARO ) 2.5 mg/0.5 mL Subcutaneous Pen Injector Inject 0.5 mL (2.5 mg total) under the skin Every 7 days Indications: type 2 diabetes mellitus        Objective :  There were no vitals taken for this visit.      PHQ Total Score  09/21/2023     3:16 PM 10/19/2023    10:01 AM 01/24/2024     9:06 AM   Most Recent PHQ-9 Scores   PHQ 9 Total 19 13 16           Mental Status Exam  AXOX4. Casual dress, calm, well-groomed. No SI, HI, AVH, delusions, or paranoia. Thoughts are logical, coherent, and goal-directed. Good eye contact. Speech is normal in rate and tone. Mood is okay affect congruent. No psychomotor agitation or retardation, cogwheel rigidity, or abnormal movements. Gait is normal. Attention, concentration, and memory are good. No cognitive deficits noted. Judgment and insight are fair. Calculation and abstraction are within normal limits.     Data Reviewed  I have reviewed patient's previous note medical, surgical, family, and social history in detail today,     Assessment  Bipolar Disorder, Generalized Anxiety, Panic Disorder, PTSD, and OCD    Plan    Escitalopram  Oxalate 20 mg  take 1 tab by mouth daily for anxiety, Depression, PTSD and OCD  Lorazepam  1 mg take 1 tab by mouth twice daily as needed for anxiety and panic  Lamictal  100 mg take by mouth twice daily  Start Risperidone  0.5 mg take 1 tab by mouth daily for 2 weeks then increase to 1 mg  by mouth daily    Follow up  Follow up in 4 weeks    Virgin Grill, APRN,PMHNP-BC          [1]   Patient Active Problem List  Diagnosis    Acid reflux    GAD (generalized anxiety disorder)    Vitamin D  deficiency    Vitamin B 12 deficiency    Panic attacks    Bipolar II disorder (CMS HCC)    Obsessive-compulsive disorder    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)    PTSD (post-traumatic stress disorder)    Prediabetes    Depression    H/O dizziness    History of IBS    Hypomagnesemia    Chronic headaches   [2]   Past Medical History:  Diagnosis Date    Acid reflux     B12 deficiency     Cervical disc disease     Chronic headaches     Depression     GAD (generalized anxiety disorder)     H/O dizziness     History of IBS     Iron deficiency anemia, unspecified     Low magnesium  level     OCD (obsessive compulsive disorder)     Panic attacks     Prediabetes     PTSD (post-traumatic stress disorder)     Recurrent major depression     TMJ (dislocation of temporomandibular joint)     Vitamin B 12 deficiency     Vitamin D  deficiency    [3]   Past Surgical History:  Procedure Laterality Date    COLONOSCOPY N/A 2021    Dr.Patel    ESOPHAGOGASTRODUODENOSCOPY  2021    HX HYSTERECTOMY N/A 07/2020    HX TUBAL LIGATION     [4]   Family History  Problem Relation Name Age of Onset    Hypertension (High Blood Pressure) Mother      Anxiety Mother      Diabetes Mother      Depression Mother      Anxiety Father      Depression Father      Diabetes type II Father      Alcohol  abuse Father      Melanoma Father      Anxiety Brother      Depression Brother      Other Brother          SUD on methadone     Suicidality Maternal Uncle       Hypertension (High Blood Pressure) Maternal Grandmother      Alcohol abuse Maternal Grandfather      Hypertension (High Blood Pressure) Paternal Grandmother      Alcohol abuse Paternal Grandfather      Anxiety Daughter          age 23 in 2023    No Known Problems Daughter          Age 35 in 2023    No Known Problems Daughter          age 48 in 2023    Coronary Artery Disease Other          Maternal uncle

## 2024-03-13 NOTE — Patient Instructions (Signed)
 Escitalopram  Oxalate 20 mg take 1 tab by mouth daily for anxiety, Depression, PTSD and OCD  Lorazepam  1 mg take 1 tab by mouth twice daily as needed for anxiety and panic  Start Risperidone  0.5 mg take 1 tab by mouth daily for 2 weeks then increase to 1 mg  by mouth daily

## 2024-04-03 ENCOUNTER — Other Ambulatory Visit: Payer: Self-pay

## 2024-04-03 ENCOUNTER — Encounter (INDEPENDENT_AMBULATORY_CARE_PROVIDER_SITE_OTHER): Payer: Self-pay | Admitting: PHYSICIAN ASSISTANT

## 2024-04-03 ENCOUNTER — Ambulatory Visit (HOSPITAL_COMMUNITY)

## 2024-04-03 ENCOUNTER — Ambulatory Visit: Payer: Self-pay | Attending: Internal Medicine | Admitting: PHYSICIAN ASSISTANT

## 2024-04-03 ENCOUNTER — Ambulatory Visit (INDEPENDENT_AMBULATORY_CARE_PROVIDER_SITE_OTHER): Payer: Self-pay | Admitting: PHYSICIAN ASSISTANT

## 2024-04-03 VITALS — BP 123/69 | HR 85 | Temp 97.7°F | Ht 63.0 in | Wt 189.0 lb

## 2024-04-03 DIAGNOSIS — Z01818 Encounter for other preprocedural examination: Secondary | ICD-10-CM | POA: Insufficient documentation

## 2024-04-03 DIAGNOSIS — E119 Type 2 diabetes mellitus without complications: Secondary | ICD-10-CM

## 2024-04-03 DIAGNOSIS — A630 Anogenital (venereal) warts: Secondary | ICD-10-CM

## 2024-04-03 LAB — CBC WITH DIFF
BASOPHIL #: 0.1 10*3/uL (ref 0.00–0.10)
BASOPHIL %: 1 % (ref 0–1)
EOSINOPHIL #: 0.2 10*3/uL (ref 0.00–0.50)
EOSINOPHIL %: 2 % (ref 1–7)
HCT: 42.8 % — ABNORMAL HIGH (ref 31.2–41.9)
HGB: 14.9 g/dL — ABNORMAL HIGH (ref 10.9–14.3)
LYMPHOCYTE #: 2.2 10*3/uL (ref 1.10–3.10)
LYMPHOCYTE %: 31 % (ref 16–46)
MCH: 29 pg (ref 24.7–32.8)
MCHC: 34.7 g/dL (ref 32.3–35.6)
MCV: 83.6 fL (ref 75.5–95.3)
MONOCYTE #: 0.4 10*3/uL (ref 0.20–0.90)
MONOCYTE %: 5 % (ref 4–11)
MPV: 9.6 fL (ref 7.9–10.8)
NEUTROPHIL #: 4.3 10*3/uL (ref 1.90–8.20)
NEUTROPHIL %: 61 % (ref 43–77)
PLATELETS: 188 10*3/uL (ref 140–440)
RBC: 5.13 10*6/uL — ABNORMAL HIGH (ref 3.63–4.92)
RDW: 14.1 % (ref 12.3–17.7)
WBC: 7.1 10*3/uL (ref 3.8–11.8)

## 2024-04-03 LAB — COMPREHENSIVE METABOLIC PNL, FASTING
ALBUMIN/GLOBULIN RATIO: 1.3 (ref 0.8–1.4)
ALBUMIN: 4.2 g/dL (ref 3.5–5.7)
ALKALINE PHOSPHATASE: 88 U/L (ref 34–104)
ALT (SGPT): 17 U/L (ref 7–52)
ANION GAP: 9 mmol/L (ref 4–13)
AST (SGOT): 14 U/L (ref 13–39)
BILIRUBIN TOTAL: 0.5 mg/dL (ref 0.3–1.0)
BUN/CREA RATIO: 7 (ref 6–22)
BUN: 6 mg/dL — ABNORMAL LOW (ref 7–25)
CALCIUM, CORRECTED: 9.1 mg/dL (ref 8.9–10.8)
CALCIUM: 9.3 mg/dL (ref 8.6–10.3)
CHLORIDE: 106 mmol/L (ref 98–107)
CO2 TOTAL: 22 mmol/L (ref 21–31)
CREATININE: 0.81 mg/dL (ref 0.60–1.30)
ESTIMATED GFR: 91 mL/min/{1.73_m2} (ref >59)
GLOBULIN: 3.3 (ref 2.0–3.5)
GLUCOSE: 100 mg/dL (ref 74–109)
OSMOLALITY, CALCULATED: 272 mosm/kg (ref 270–290)
POTASSIUM: 3.7 mmol/L (ref 3.5–5.1)
PROTEIN TOTAL: 7.5 g/dL (ref 6.4–8.9)
SODIUM: 137 mmol/L (ref 136–145)

## 2024-04-03 LAB — HGA1C (HEMOGLOBIN A1C WITH EST AVG GLUCOSE): HEMOGLOBIN A1C: 6.3 % — ABNORMAL HIGH (ref 4.0–6.0)

## 2024-04-03 LAB — MAGNESIUM: MAGNESIUM: 1.9 mg/dL (ref 1.9–2.7)
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
## 2024-04-09 ENCOUNTER — Other Ambulatory Visit (INDEPENDENT_AMBULATORY_CARE_PROVIDER_SITE_OTHER): Payer: Self-pay | Admitting: PHYSICIAN ASSISTANT

## 2024-04-09 MED ORDER — MAGNESIUM 500 MG (AS MAGNESIUM OXIDE) TABLET
500.0000 mg | ORAL_TABLET | Freq: Every day | ORAL | 0 refills | Status: AC
Start: 2024-04-09 — End: ?

## 2024-04-12 ENCOUNTER — Ambulatory Visit (INDEPENDENT_AMBULATORY_CARE_PROVIDER_SITE_OTHER): Payer: Self-pay | Admitting: PHYSICIAN ASSISTANT

## 2024-04-17 ENCOUNTER — Other Ambulatory Visit: Payer: Self-pay

## 2024-04-17 ENCOUNTER — Encounter (HOSPITAL_COMMUNITY): Payer: Self-pay | Admitting: Obstetrics & Gynecology

## 2024-04-17 ENCOUNTER — Ambulatory Visit: Payer: Self-pay | Attending: PHYSICIAN ASSISTANT | Admitting: PHYSICIAN ASSISTANT

## 2024-04-17 ENCOUNTER — Encounter (INDEPENDENT_AMBULATORY_CARE_PROVIDER_SITE_OTHER): Payer: Self-pay | Admitting: PHYSICIAN ASSISTANT

## 2024-04-17 VITALS — BP 109/73 | HR 77 | Temp 98.3°F | Resp 18 | Ht 63.0 in | Wt 187.2 lb

## 2024-04-17 DIAGNOSIS — K219 Gastro-esophageal reflux disease without esophagitis: Secondary | ICD-10-CM | POA: Insufficient documentation

## 2024-04-17 DIAGNOSIS — F419 Anxiety disorder, unspecified: Secondary | ICD-10-CM | POA: Insufficient documentation

## 2024-04-17 DIAGNOSIS — E119 Type 2 diabetes mellitus without complications: Secondary | ICD-10-CM | POA: Insufficient documentation

## 2024-04-17 LAB — MICROALBUMIN/CREATININE RATIO, URINE, RANDOM
CREATININE RANDOM URINE: 254 mg/dL — ABNORMAL HIGH (ref 30–125)
MICROALBUMIN RANDOM URINE: 1.9 mg/dL
MICROALBUMIN/CREATININE RATIO RANDOM URINE: 7.5 mg/g

## 2024-04-17 MED ORDER — MOUNJARO 5 MG/0.5 ML SUBCUTANEOUS PEN INJECTOR
5.0000 mg | PEN_INJECTOR | SUBCUTANEOUS | 1 refills | Status: DC
Start: 2024-04-17 — End: 2024-05-29

## 2024-04-17 NOTE — Assessment & Plan Note (Addendum)
 Continue Pepcid  bid  Omeprazole  once a day  If reflux returns will adjust tx plan

## 2024-04-17 NOTE — Progress Notes (Signed)
 FAMILY MEDICINE, MEDICAL OFFICE BUILDING  [address]  [place] [address]  Operated by [facility]    Progress Note    Latoya Harrison  07-22-1976  [id number]    Date of Service: 04/17/2024  8:30 AM EDT    Chief complaint:   Chief Complaint   Patient presents with    Follow Up 4 Weeks       Subjective:     This is a case of a 48 y.o. year old female who comes in today for follow up on med changes. Pt.states she is tolerating meds without problems. Pt.states a little nausea with the Mounjaro  but not bad and she's having early satiety. No abdominal pain no bleeding with BM. Pt.states no other concerns. Pt.did have a reflux/regurg episode and sulfur burpsat times. Pt.scheduled with Dr.Lingenfelter tomorrow.     Review of Systems   All systems reviewed and negative except for what is noted above.      Current Outpatient Medications   Medication Sig    atorvastatin  (LIPITOR) 10 mg Oral Tablet Take 1 tablet by mouth once daily    Blood Sugar Diagnostic (BLOOD GLUCOSE TEST) Strip Use as directed to check blood sugar twice daily and as needed.    cyanocobalamin  (VITAMIN B12) 1,000 mcg/mL Injection Solution Inject 1 mL (1,000 mcg total) under the skin Every 7 days for 90 days    ergocalciferol , vitamin D2, (DRISDOL ) 1,250 mcg (50,000 unit) Oral Capsule Take 1 capsule by mouth once a week    escitalopram  oxalate (LEXAPRO ) 20 mg Oral Tablet Take 1 Tablet (20 mg total) by mouth Daily Indications: bipolar depression    famotidine  (PEPCID ) 40 mg Oral Tablet Take 1 tablet by mouth twice daily    imiquimod (ALDARA) 5 % Cream in Packet Apply 1 Packet topically Every SUN, TUES and THURS (Patient not taking: Reported on 04/17/2024)    lamoTRIgine  (LAMICTAL ) 100 mg Oral Tablet Take 1 Tablet (100 mg total) by mouth Twice daily Indications: bipolar depression    lancets (UNIVERSAL 1 LANCETS) 26 gauge Misc Fingersticks 2 times daily and as needed.    LORazepam  (ATIVAN ) 1 mg Oral Tablet Take 1 Tablet (1 mg  total) by mouth Twice per day as needed for Anxiety    magnesium  Oxide 500 mg magnesium  Oral Tablet Take 1 Tablet (500 mg total) by mouth Daily    meclizine  (ANTIVERT ) 25 mg Oral Tablet Take 1 Tablet (25 mg total) by mouth Every 8 hours as needed    omeprazole  (PRILOSEC) 40 mg Oral Capsule, Delayed Release(E.C.) Take 1 capsule by mouth once daily    propranoloL  (INDERAL  LA) 120 mg Oral Capsule,Sustained Action 24 hr Take 1 Capsule (120 mg total) by mouth Once a day for 90 days    risperiDONE  (RISPERDAL ) 0.5 mg Oral Tablet Take 1 Tablet (0.5 mg total) by mouth Daily for 14 days. Then take 2 tabs (1 mg total) daily.    tirzepatide  (MOUNJARO ) 5 mg/0.5 mL Subcutaneous Pen Injector Inject 0.5 mL (5 mg total) under the skin Every 7 days Indications: type 2 diabetes mellitus       Objective:     BP 109/73 (Site: Left Arm, Patient Position: Sitting, Cuff Size: Adult)   Pulse 77   Temp 36.8 C (98.3 F) (Tympanic)   Resp 18   Ht 1.6 m (5' 3)   Wt 84.9 kg (187 lb 3.2 oz)   SpO2 96%   BMI 33.16 kg/m       BMI  addressed: Advised on diet, weight loss, and exercise to reduce above normal BMI.  Adjust meds as discussed with pt.      Physical Exam  Vitals and nursing note reviewed.   Constitutional:       Appearance: Normal appearance.   HENT:      Head: Normocephalic.      Mouth/Throat:      Mouth: Mucous membranes are moist.   Eyes:      Extraocular Movements: Extraocular movements intact.      Conjunctiva/sclera: Conjunctivae normal.   Cardiovascular:      Rate and Rhythm: Normal rate and regular rhythm.      Heart sounds: Normal heart sounds.   Pulmonary:      Effort: Pulmonary effort is normal.      Breath sounds: Normal breath sounds.   Abdominal:      General: Abdomen is flat. Bowel sounds are normal.      Palpations: Abdomen is soft.      Tenderness: There is no abdominal tenderness. There is no guarding.   Neurological:      Mental Status: She is alert.       Assessment & Plan  Type 2 diabetes mellitus  D/C  Glucophage   Increase Mounjaro  5 mg once a week  Monitor for any side effects  Orders:    MICROALBUMIN/CREATININE RATIO, URINE, RANDOM; Future    Acid reflux  Continue Pepcid  bid  Omeprazole  once a day  If reflux returns will adjust tx plan          Other orders    tirzepatide  (MOUNJARO ) 5 mg/0.5 mL Subcutaneous Pen Injector; Inject 0.5 mL (5 mg total) under the skin Every 7 days Indications: type 2 diabetes mellitus          The patient was given the opportunity to ask questions and those questions were answered to the patient's satisfaction. The patient was encouraged to call with any additional questions or concerns.    Follow up: Return in about 6 weeks (around 05/29/2024), or 30 minute.    Gerardine Ammon, PA-C

## 2024-04-18 ENCOUNTER — Encounter (HOSPITAL_COMMUNITY)
Admission: RE | Disposition: A | Discharge status: Home / Self Care | Payer: Self-pay | Source: Ambulatory Visit | Attending: Obstetrics & Gynecology

## 2024-04-18 ENCOUNTER — Ambulatory Visit
Admission: RE | Admit: 2024-04-18 | Discharge: 2024-04-18 | Disposition: A | Discharge status: Home / Self Care | Payer: Self-pay | Source: Ambulatory Visit | Attending: Obstetrics & Gynecology | Admitting: Obstetrics & Gynecology

## 2024-04-18 ENCOUNTER — Ambulatory Visit (HOSPITAL_COMMUNITY)

## 2024-04-18 ENCOUNTER — Encounter (HOSPITAL_COMMUNITY): Payer: Self-pay | Admitting: Obstetrics & Gynecology

## 2024-04-18 DIAGNOSIS — N901 Moderate vulvar dysplasia: Secondary | ICD-10-CM | POA: Insufficient documentation

## 2024-04-18 HISTORY — DX: Anxiety disorder, unspecified: F41.9

## 2024-04-18 HISTORY — DX: Other general symptoms and signs: R68.89

## 2024-04-18 HISTORY — DX: Diarrhea, unspecified: R19.7

## 2024-04-18 HISTORY — DX: Type 2 diabetes mellitus without complications: E11.9

## 2024-04-18 HISTORY — DX: Dorsopathy, unspecified: M53.9

## 2024-04-18 HISTORY — DX: Presence of spectacles and contact lenses: Z97.3

## 2024-04-18 HISTORY — DX: Irritable bowel syndrome, unspecified: K58.9

## 2024-04-18 HISTORY — DX: Benign paroxysmal vertigo, unspecified ear: H81.10

## 2024-04-18 LAB — POC BLOOD GLUCOSE (RESULTS): GLUCOSE, POC: 110 mg/dL — ABNORMAL HIGH (ref 70–100)

## 2024-04-18 SURGERY — LASER VULVA WITH CO2 LASER
Anesthesia: General | Site: Vagina | Wound class: Dirty or Infected Wounds-Include old traumatic wounds

## 2024-04-18 MED ORDER — ONDANSETRON HCL (PF) 4 MG/2 ML INJECTION SOLUTION
4.0000 mg | Freq: Once | INTRAMUSCULAR | Status: DC | PRN
Start: 2024-04-18 — End: 2024-04-18

## 2024-04-18 MED ORDER — FENTANYL (PF) 50 MCG/ML INJECTION WRAPPER
50.0000 ug | INJECTION | INTRAMUSCULAR | Status: DC | PRN
Start: 2024-04-18 — End: 2024-04-18

## 2024-04-18 MED ORDER — LACTATED RINGERS INTRAVENOUS SOLUTION
INTRAVENOUS | Status: DC
Start: 2024-04-18 — End: 2024-04-18

## 2024-04-18 MED ORDER — LIDOCAINE (PF) 100 MG/5 ML (2 %) INTRAVENOUS SYRINGE
INJECTION | Freq: Once | INTRAVENOUS | Status: DC | PRN
Start: 2024-04-18 — End: 2024-04-18
  Administered 2024-04-18: 50 mg via INTRAVENOUS

## 2024-04-18 MED ORDER — LACTATED RINGERS INTRAVENOUS SOLUTION
INTRAVENOUS | Status: DC | PRN
Start: 2024-04-18 — End: 2024-04-18
  Administered 2024-04-18: 0 via INTRAVENOUS

## 2024-04-18 MED ORDER — IPRATROPIUM 0.5 MG-ALBUTEROL 3 MG (2.5 MG BASE)/3 ML NEBULIZATION SOLN
3.0000 mL | INHALATION_SOLUTION | Freq: Once | RESPIRATORY_TRACT | Status: DC | PRN
Start: 2024-04-18 — End: 2024-04-18

## 2024-04-18 MED ORDER — MIDAZOLAM 5 MG/ML INJECTION WRAPPER
INTRAMUSCULAR | Status: AC
Start: 2024-04-18 — End: 2024-04-18
  Filled 2024-04-18: qty 1

## 2024-04-18 MED ORDER — SODIUM CHLORIDE 0.9 % (FLUSH) INJECTION SYRINGE
3.0000 mL | INJECTION | Freq: Three times a day (TID) | INTRAMUSCULAR | Status: DC
Start: 2024-04-18 — End: 2024-04-18

## 2024-04-18 MED ORDER — HYDROMORPHONE 2 MG/ML INJECTION WRAPPER
0.2000 mg | INJECTION | INTRAMUSCULAR | Status: DC | PRN
Start: 2024-04-18 — End: 2024-04-18

## 2024-04-18 MED ORDER — SODIUM CHLORIDE 0.9 % (FLUSH) INJECTION SYRINGE
3.0000 mL | INJECTION | INTRAMUSCULAR | Status: DC | PRN
Start: 2024-04-18 — End: 2024-04-18

## 2024-04-18 MED ORDER — DEXAMETHASONE SODIUM PHOSPHATE 4 MG/ML INJECTION SOLUTION
Freq: Once | INTRAMUSCULAR | Status: DC | PRN
Start: 2024-04-18 — End: 2024-04-18
  Administered 2024-04-18: 4 mg via INTRAVENOUS

## 2024-04-18 MED ORDER — ONDANSETRON HCL (PF) 4 MG/2 ML INJECTION SOLUTION
4.0000 mg | INTRAMUSCULAR | Status: DC | PRN
Start: 2024-04-18 — End: 2024-04-18

## 2024-04-18 MED ORDER — KETOROLAC 30 MG/ML (1 ML) INJECTION SOLUTION
Freq: Once | INTRAMUSCULAR | Status: DC | PRN
Start: 2024-04-18 — End: 2024-04-18
  Administered 2024-04-18: 30 mg via INTRAVENOUS

## 2024-04-18 MED ORDER — PROCHLORPERAZINE EDISYLATE 10 MG/2 ML (5 MG/ML) INJECTION SOLUTION
5.0000 mg | Freq: Once | INTRAMUSCULAR | Status: DC | PRN
Start: 2024-04-18 — End: 2024-04-18

## 2024-04-18 MED ORDER — PROPOFOL 10 MG/ML IV BOLUS
INJECTION | Freq: Once | INTRAVENOUS | Status: DC | PRN
Start: 2024-04-18 — End: 2024-04-18
  Administered 2024-04-18: 350 mg via INTRAVENOUS

## 2024-04-18 MED ORDER — ONDANSETRON HCL (PF) 4 MG/2 ML INJECTION SOLUTION
Freq: Once | INTRAMUSCULAR | Status: DC | PRN
Start: 2024-04-18 — End: 2024-04-18
  Administered 2024-04-18: 4 mg via INTRAVENOUS

## 2024-04-18 MED ORDER — ETHYL ALCOHOL 62 % TOPICAL SWAB
1.0000 | Freq: Once | CUTANEOUS | Status: AC
Start: 2024-04-18 — End: 2024-04-18
  Administered 2024-04-18: 1 via NASAL

## 2024-04-18 MED ORDER — KETOROLAC 30 MG/ML (1 ML) INJECTION SOLUTION
15.0000 mg | Freq: Four times a day (QID) | INTRAMUSCULAR | Status: DC | PRN
Start: 2024-04-18 — End: 2024-04-18

## 2024-04-18 MED ORDER — SILVER SULFADIAZINE 1 % TOPICAL CREAM
TOPICAL_CREAM | CUTANEOUS | Status: AC
Start: 2024-04-18 — End: 2024-04-18
  Filled 2024-04-18: qty 50

## 2024-04-18 MED ORDER — ALBUTEROL SULFATE 2.5 MG/3 ML (0.083 %) SOLUTION FOR NEBULIZATION
2.5000 mg | INHALATION_SOLUTION | Freq: Once | RESPIRATORY_TRACT | Status: DC | PRN
Start: 2024-04-18 — End: 2024-04-18

## 2024-04-18 MED ORDER — FENTANYL (PF) 50 MCG/ML INJECTION WRAPPER
25.0000 ug | INJECTION | INTRAMUSCULAR | Status: DC | PRN
Start: 2024-04-18 — End: 2024-04-18

## 2024-04-18 MED ORDER — FENTANYL (PF) 50 MCG/ML INJECTION SOLUTION
INTRAMUSCULAR | Status: AC
Start: 2024-04-18 — End: 2024-04-18
  Filled 2024-04-18: qty 2

## 2024-04-18 MED ORDER — OXYCODONE-ACETAMINOPHEN 5 MG-325 MG TABLET
1.0000 | ORAL_TABLET | ORAL | Status: DC | PRN
Start: 2024-04-18 — End: 2024-04-18
  Administered 2024-04-18: 1 via ORAL
  Filled 2024-04-18: qty 1

## 2024-04-18 MED ORDER — PROCHLORPERAZINE EDISYLATE 10 MG/2 ML (5 MG/ML) INJECTION SOLUTION
10.0000 mg | Freq: Four times a day (QID) | INTRAMUSCULAR | Status: DC | PRN
Start: 2024-04-18 — End: 2024-04-18

## 2024-04-18 MED ORDER — MIDAZOLAM 5 MG/ML INJECTION WRAPPER
Freq: Once | INTRAMUSCULAR | Status: DC | PRN
Start: 2024-04-18 — End: 2024-04-18
  Administered 2024-04-18 (×2): 2.5 mg via INTRAVENOUS

## 2024-04-18 MED ORDER — OXYCODONE-ACETAMINOPHEN 5 MG-325 MG TABLET
2.0000 | ORAL_TABLET | ORAL | Status: DC | PRN
Start: 2024-04-18 — End: 2024-04-18

## 2024-04-18 SURGICAL SUPPLY — 15 items
BLADE 15 2 END CBNSTL SURG STRL DISP (SURGICAL CUTTING SUPPLIES) ×1 IMPLANT
CLEANER INSTRUMENT PRE-KLENZ 13.5 OZ (MISCELLANEOUS PT CARE ITEMS) ×1 IMPLANT
CONTAINR CLICKSEAL 4OZ TRANSLUC SCREW CAP STRL BLU SPECI PNEUM TUBE SYS (SPECIMEN COLLECTION SUPPLIES) IMPLANT
CONV USE ITEM 321854 - GLOVE SURG 6 LF  BEAD CUF SMOOTH HI GRIP WHT 12IN MDCHC PLISPRN (GLOVES AND ACCESSORIES) ×1 IMPLANT
CONV USE ITEM 321997 - GLOVE SURG 7 LF PF BEAD CUF SMOOTH STRL WHT 12 IN (GLOVES AND ACCESSORIES) ×1 IMPLANT
COVER EQP 90X60IN HVDTY BCK PAD FNFLD BLU STRL (DRAPE/PACKS/SHEETS/OR TOWEL) ×1 IMPLANT
GLOVE SURG 8 LTX PF SMOOTH BEAD CUF STRL YW 12IN PROTEXIS NEU-THERA DDRGL THK8.7 MIL (GLOVES AND ACCESSORIES) ×1 IMPLANT
GOWN SURG LRG STD LGTH L3 HKLP CLSR RGLN SLEEVE TWL STRL LF  DISP GRN AERO BLU PRFRM FBRC (DRAPE/PACKS/SHEETS/OR TOWEL) IMPLANT
GOWN SURG XL STD LGTH L3 HKLP CLSR RGLN SLEEVE TWL STRL LF  DISP GRN AERO BLU PRFRM FBRC (DRAPE/PACKS/SHEETS/OR TOWEL) ×1 IMPLANT
GOWN SURG XL XLNG L4 REINF HKLP CLSR SET IN SLEEVE STRL LF  DISP BLU SIRUS SMS PE 56IN (DRAPE/PACKS/SHEETS/OR TOWEL) ×1 IMPLANT
HANDPC SUCT MEDIVAC YANKAUER BLBS TIP CLR STRL LF  DISP (MED SURG SUPPLIES) ×1 IMPLANT
PACK SURG SIRUS OB III REINF TBL CVR GRAD UNDERBUTTOCK STRL 76X44IN 46X33.5IN POLY LF (MED SURG SUPPLIES) ×1 IMPLANT
PENCIL SMOKEVAC COAT PSHBTN LF (MED SURG SUPPLIES) ×1 IMPLANT
TOWEL 24X16IN COTTON BLU DISP SURG STRL LF (DRAPE/PACKS/SHEETS/OR TOWEL) ×2 IMPLANT
TUBING SUCT CLR 12FT .25IN ARGYLE PVC NCDTV STR MALE FEMALE MLD CONN STRL LF (MED SURG SUPPLIES) ×1 IMPLANT

## 2024-04-18 NOTE — Nurses Notes (Signed)
 Bedside hand off given un:Fjmdyj Bostic, RN  Vitals Signs as follows: Temp:97.32f                                        HR:75                                        Resp:18                                        BP:125/57                                        O2:96% Room air  IV patent  Dressing Status:Peri pad continues to no drainage.vital signs obtained.

## 2024-04-18 NOTE — Discharge Instructions (Signed)
 Follow up with Dr. Adeline on Monday, May 06, 2024 at 10:30 a.m.    Call for any problems or concerns.

## 2024-04-18 NOTE — Anesthesia Postprocedure Evaluation (Signed)
 Anesthesia Post Op Evaluation    Patient: Latoya Harrison  Procedure(s):  EXCISION OF GENITAL WARTS    Last Vitals:Temperature: 36.3 C (97.3 F) (04/18/24 0811)  Heart Rate: 75 (04/18/24 0811)  BP (Non-Invasive): (!) 104/52 (04/18/24 9188)  Respiratory Rate: 20 (04/18/24 0811)  SpO2: 97 % (04/18/24 0811)    No notable events documented.    Patient is sufficiently recovered from the effects of anesthesia to participate in the evaluation and has returned to their pre-procedure level.  Patient location during evaluation: PACU       Patient participation: complete - patient participated  Level of consciousness: awake and alert and responsive to verbal stimuli    Pain management: adequate  Airway patency: patent    Anesthetic complications: no  Cardiovascular status: acceptable  Respiratory status: acceptable  Hydration status: acceptable  Patient post-procedure temperature: Pt Normothermic   PONV Status: Absent

## 2024-04-18 NOTE — Anesthesia Preprocedure Evaluation (Signed)
 ANESTHESIA PRE-OP EVALUATION  Planned Procedure: ABLATION OF GENITAL WARTS (Vagina )  Review of Systems     anesthesia history negative     patient summary reviewed          Pulmonary   current smoker and Denies smoking in last 24  hours,   Cardiovascular    No peripheral edema,  Exercise Tolerance: > or = 4 METS        GI/Hepatic/Renal    GERD and well controlled        Endo/Other      type 2 diabetes/ stable    Neuro/Psych/MS    headaches, back abnormality, anxiety, depression, Neck problems     Cancer                        Physical Assessment      Airway       Mallampati: III    TM distance: <3 FB    Neck ROM: full  Mouth Opening: good.            Dental       Dentition intact             Pulmonary    Breath sounds clear to auscultation  (-) no rhonchi, no decreased breath sounds, no wheezes, no rales and no stridor     Cardiovascular    Rhythm: regular  Rate: Normal  (-) no friction rub, carotid bruit is not present, no peripheral edema and no murmur     Other findings              Plan  ASA 3     Planned anesthesia type: general     general anesthesia with endotracheal tube intubation      PONV Plan:  I plan to administer pharmcologic prophalaxis antiemetics  POV PLAN:   plan for postoperative opioid use            Intravenous induction     Anesthesia issues/risks discussed are: Dental Injuries, Nerve Injuries, Eye /Visual Loss, PONV, Stroke, Aspiration, Difficult Airway, Sore Throat and Cardiac Events/MI.  Anesthetic plan and risks discussed with patient  signed consent obtained          Patient's NPO status is appropriate for Anesthesia.           Plan discussed with CRNA.

## 2024-04-18 NOTE — OR Surgeon (Signed)
 [facility]  Operative Note    Patient Name: Latoya Harrison, Latoya Harrison [facility] Number: [id number]  Date of Service: 04/18/2024   Date of Birth: 1975/10/08      Pre-Operative Diagnosis: GENITAL WARTS     Post-Operative Diagnosis: GENITAL WARTS    Procedure(s)/Description:  EXCISION OF GENITAL WARTS: [phone] (CPT)       Findings: GENITAL WARTS external female genitalia with significant amount of genital warts on the left labia externally from mid portion of the vagina to the posterior fourchette.  There were genital warts on the inside of the left labia on the anterior 1/3 and the right labia on the inside posterior or inferior 1/3.  There were warts extending down to the rectal area.  Smaller size was 3 mm with the largest size being 1-1/2 cm.    Attending Surgeon: Penne CHRISTELLA Dyers, DO     Anesthesia:  Anesthesiologist: Ethyl Ingle, MD  CRNA: Melina Pulling, CRNA    Anesthesia Type: .General     First Assist: None    Estimated Blood Loss:  Minimal    Specimens Removed:   ID Type Source Tests Collected by Time Destination   1 : RECTOGENITAL WARTS Tissue Other (Specify Site in comments) SURGICAL PATHOLOGY SPECIMEN Dyers Penne CHRISTELLA, DO 04/18/2024 1313       Order Name Source Comment Collection Info Order Time   SURGICAL PATHOLOGY SPECIMEN Other (Specify Site in comments) Pre-op diagnosis:  GENITAL WARTS Collected By: Dyers Penne CHRISTELLA, DO 04/18/2024  1:14 PM     Release to patient   Automated             Complications:  None      Condition: stable    Disposition: PACU - hemodynamically stable.    Description of Procedure:    After confirming the validity consents patient was transported to operating room placed on operating table.  After adequate anesthesia the patient's legs placed in stirrups she was sterilely prepped and draped in usual fashion.  Surgical time-out was performed.  An Adson forceps was used to lift the large warts and excised them with the monopolar electrocautery.   This was started at the perirectal area and continued up through the left labia from the posterior fourchette up to the midportion of the vagina.  The remaining small genital warts were ablated using the monopolar electrocautery.  Hemostasis was good and Silvadene  was applied.  The patient was awakened and transported to PACU in stable condition.  All sponge and instrument counts were correct x3.      Penne CHRISTELLA Dyers D.O., Ph.D., FACOG

## 2024-04-18 NOTE — Anesthesia Transfer of Care (Signed)
 ANESTHESIA TRANSFER OF CARE   Latoya Harrison is a 48 y.o. ,female, Weight: 84.4 kg (186 lb)   had Procedure(s):  EXCISION OF GENITAL WARTS  performed  04/18/24   Primary Service: [name]*    Past Medical History:   Diagnosis Date    Acid reflux     Anxiety     B12 deficiency     Back problem     Benign paroxysmal positional vertigo     Cervical disc disease     Chronic headaches     Depression     Diarrhea     GAD (generalized anxiety disorder)     History of IBS     Iron deficiency anemia, unspecified     Irritable bowel syndrome     Low magnesium  level     Neck problem     OCD (obsessive compulsive disorder)     Panic attacks     Prediabetes     PTSD (post-traumatic stress disorder)     TMJ (dislocation of temporomandibular joint)     Type 2 diabetes mellitus     Vitamin B 12 deficiency     Vitamin D  deficiency     Wears glasses       Allergy History as of 04/18/24       PERTUSSIS VACCINE,ADSORBED         Noted Status Severity Type Reaction    08/05/22 1216 Clarisa Service, RN 01/05/22 Active High   Other Adverse Reaction (Add comment)    Comments: Pt can't remember what happened but was put in the hospital.     01/05/22 1412 Dwane Boyer, RN 01/05/22 Active                 LEVOFLOXACIN         Noted Status Severity Type Reaction    08/05/22 1218 Clarisa Service, RN 08/05/22 Active High  Mental Status Effect    Comments: Night terrors and hallucinations                   I completed my transfer of care / handoff to the receiving personnel during which we discussed:  Access, Airway, All key/critical aspects of case discussed, Analgesia and Antibiotics      Post Location: PACU                                                             Last OR Temp: Temperature: 36.4 C (97.5 F)  ABG:  POTASSIUM   Date Value Ref Range Status   04/03/2024 3.7 3.5 - 5.1 mmol/L Final     CALCIUM   Date Value Ref Range Status   04/03/2024 9.3 8.6 - 10.3 mg/dL Final     CANNAQL   Date Value Ref Range Status    07/19/2022 Negative Negative Final     BENZO QL   Date Value Ref Range Status   07/19/2022 Negative Negative Final     Airway:* No LDAs found *  Blood pressure (!) 117/52, pulse 75, temperature 36.4 C (97.5 F), resp. rate 18, height 1.6 m (5' 2.99), weight 84.4 kg (186 lb), SpO2 99%.

## 2024-04-18 NOTE — H&P (Signed)
 Document sent from office to be scanned in

## 2024-04-18 NOTE — Interval H&P Note (Signed)
 [facility]       H&P UPDATE FORM                                                                                    Patient's Name: Latoya Harrison 48 y.o. female  Date of Admission:  04/18/2024  Patient's Date of Birth: January 30, 1976    04/18/2024       H & P updated the day of the procedure.  1.  H&P completed within 30 days of surgical procedure  and has been reviewed within 24 hours of the surgery, the patient has been examined, and no change has occured in the patients condition since the H&P was completed.       Change in medications: No    2.  Patient continues to be appropriate candidate for planned surgical procedure. YES        Penne CHRISTELLA Dyers, DO, PhD, FACOG

## 2024-04-19 ENCOUNTER — Ambulatory Visit (INDEPENDENT_AMBULATORY_CARE_PROVIDER_SITE_OTHER): Payer: Self-pay | Admitting: PHYSICIAN ASSISTANT

## 2024-04-19 DIAGNOSIS — D071 Carcinoma in situ of vulva: Secondary | ICD-10-CM

## 2024-04-19 DIAGNOSIS — R944 Abnormal results of kidney function studies: Secondary | ICD-10-CM

## 2024-04-19 LAB — SURGICAL PATHOLOGY SPECIMEN

## 2024-04-24 ENCOUNTER — Ambulatory Visit: Payer: Self-pay

## 2024-04-24 ENCOUNTER — Other Ambulatory Visit: Payer: Self-pay

## 2024-04-24 ENCOUNTER — Encounter (HOSPITAL_PSYCHIATRIC): Payer: Self-pay

## 2024-04-24 VITALS — BP 108/67 | HR 84 | Resp 18 | Ht 62.0 in | Wt 186.0 lb

## 2024-04-24 DIAGNOSIS — F3181 Bipolar II disorder: Secondary | ICD-10-CM | POA: Insufficient documentation

## 2024-04-24 DIAGNOSIS — F429 Obsessive-compulsive disorder, unspecified: Secondary | ICD-10-CM | POA: Insufficient documentation

## 2024-04-24 DIAGNOSIS — F4310 Post-traumatic stress disorder, unspecified: Secondary | ICD-10-CM | POA: Insufficient documentation

## 2024-04-24 DIAGNOSIS — F319 Bipolar disorder, unspecified: Secondary | ICD-10-CM

## 2024-04-24 DIAGNOSIS — F410 Panic disorder [episodic paroxysmal anxiety] without agoraphobia: Secondary | ICD-10-CM | POA: Insufficient documentation

## 2024-04-24 DIAGNOSIS — F411 Generalized anxiety disorder: Secondary | ICD-10-CM | POA: Insufficient documentation

## 2024-04-24 MED ORDER — RISPERIDONE 0.5 MG TABLET
0.5000 mg | ORAL_TABLET | Freq: Every day | ORAL | 0 refills | Status: DC
Start: 2024-04-24 — End: 2024-05-22

## 2024-04-24 MED ORDER — LORAZEPAM 1 MG TABLET
1.0000 mg | ORAL_TABLET | Freq: Two times a day (BID) | ORAL | 0 refills | Status: DC | PRN
Start: 2024-04-24 — End: 2024-05-22

## 2024-04-24 MED ORDER — ESCITALOPRAM 20 MG TABLET
20.0000 mg | ORAL_TABLET | Freq: Every day | ORAL | 0 refills | Status: DC
Start: 2024-04-24 — End: 2024-05-22

## 2024-04-24 MED ORDER — LAMOTRIGINE 100 MG TABLET
100.0000 mg | ORAL_TABLET | Freq: Two times a day (BID) | ORAL | 0 refills | Status: DC
Start: 2024-04-24 — End: 2024-05-22

## 2024-04-24 NOTE — Progress Notes (Signed)
 BEHAVIORAL [facility]ICINE, [facility] [place]  [address]  [place] [address]  Operated by [facility]    Name: Latoya Harrison MRN:  [id number]   Date: 04/24/2024 DOB:  [birth year] (48 y.o.)       Chief Complaint: Generalized Anxiety    Subjective:   Patient reports things are going ok. Patient reports that she had some cyst removed in her groin buttocks area and is very sore. Hard to sit and walk. Went to [employer] 2 weeks ago and felt fine during the entire time. Got rid of al the puppies except one. This has relieved a lot of stress. Daughter is moving to [place] and is taking the puppy with her. Will be back to her normal routine.      Mood Good besides being in pain.   Medications So far so good. No adverse reactions.  Appetite Good. Takes Mounjaro . Has lost 14 pounds.  Sleep Sleeping ok right now due to taking a pain pill from surgery.  Energy Varies  Stressors Daughter moving to [place]. School starting soon.    Problem List[1]  Past Medical History[2]  Past Surgical History[3]  Family History[4]  Social History     Socioeconomic History    Marital status: Married   Tobacco Use    Smoking status: Every Day     Current packs/day: 1.00     Types: Cigarettes     Passive exposure: Never    Smokeless tobacco: Never   Vaping Use    Vaping status: Some Days    Substances: Nicotine, Flavoring   Substance and Sexual Activity    Alcohol  use: Yes     Comment: occasionally    Drug use: Never   Other Topics Concern    Ability to Walk 1 Flight of Steps without SOB/CP Yes    Routine Exercise Yes    Ability to Walk 2 Flight of Steps without SOB/CP No    Unable to Ambulate No    Total Care No    Ability To Do Own ADL's Yes    Uses Walker No    Other Activity Level Yes    Uses Cane No      Levaquin [levofloxacin] and Pertussis vaccine,adsorbed   Current Outpatient Medications   Medication Sig    atorvastatin  (LIPITOR) 10 mg Oral Tablet Take 1 tablet by mouth once daily    Blood  Sugar Diagnostic (BLOOD GLUCOSE TEST) Strip Use as directed to check blood sugar twice daily and as needed.    ergocalciferol , vitamin D2, (DRISDOL ) 1,250 mcg (50,000 unit) Oral Capsule Take 1 capsule by mouth once a week    escitalopram  oxalate (LEXAPRO ) 20 mg Oral Tablet Take 1 Tablet (20 mg total) by mouth Daily Indications: bipolar depression    famotidine  (PEPCID ) 40 mg Oral Tablet Take 1 tablet by mouth twice daily    lamoTRIgine  (LAMICTAL ) 100 mg Oral Tablet Take 1 Tablet (100 mg total) by mouth Twice daily Indications: bipolar depression    lancets (UNIVERSAL 1 LANCETS) 26 gauge Misc Fingersticks 2 times daily and as needed.    LORazepam  (ATIVAN ) 1 mg Oral Tablet Take 1 Tablet (1 mg total) by mouth Twice per day as needed for Anxiety    magnesium  Oxide 500 mg magnesium  Oral Tablet Take 1 Tablet (500 mg total) by mouth Daily    meclizine  (ANTIVERT ) 25 mg Oral Tablet Take 1 Tablet (25 mg total) by mouth Every 8 hours as needed  omeprazole  (PRILOSEC) 40 mg Oral Capsule, Delayed Release(E.C.) Take 1 capsule by mouth once daily    propranoloL  (INDERAL  LA) 120 mg Oral Capsule,Sustained Action 24 hr Take 1 Capsule (120 mg total) by mouth Once a day for 90 days    risperiDONE  (RISPERDAL ) 0.5 mg Oral Tablet Take 1 Tablet (0.5 mg total) by mouth Daily for 14 days. Then take 2 tabs (1 mg total) daily.    tirzepatide  (MOUNJARO ) 5 mg/0.5 mL Subcutaneous Pen Injector Inject 0.5 mL (5 mg total) under the skin Every 7 days Indications: type 2 diabetes mellitus        Objective :  There were no vitals taken for this visit.      PHQ Total Score                      10/19/2023    10:01 AM 01/24/2024     9:06 AM 03/13/2024    10:10 AM   Most Recent PHQ-9 Scores   PHQ 9 Total 13 16 17           Mental Status Exam  AXOX4. Casual dress, calm, well-groomed. No SI, HI, AVH, delusions, or paranoia. Thoughts are logical, coherent, and goal-directed. Good eye contact. Speech is normal in rate and tone. Mood is okay affect congruent. No  psychomotor agitation or retardation, cogwheel rigidity, or abnormal movements. Gait is normal. Attention, concentration, and memory are good. No cognitive deficits noted. Judgment and insight are fair. Calculation and abstraction are within normal limits.     Data Reviewed  I have reviewed patient's previous note medical, surgical, family, and social history in detail today,     Assessment  Bipolar Disorder, Generalized Anxiety, Panic Disorder, PTSD, and OCD     Plan    Escitalopram  Oxalate 20 mg take 1 tab by mouth daily for anxiety, Depression, PTSD and OCD  Lorazepam  1 mg take 1 tab by mouth twice daily as needed for anxiety and panic  Lamictal  100 mg take by mouth twice daily  Risperidone  1 mg  by mouth daily    Follow up  Follow up in 4 weeks    Jon Apa, APRN,PMHNP-BC        [1]   Patient Active Problem List  Diagnosis    Acid reflux    GAD (generalized anxiety disorder)    Vitamin D  deficiency    Vitamin B 12 deficiency    Panic attacks    Bipolar II disorder (CMS HCC)    Obsessive-compulsive disorder    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)    PTSD (post-traumatic stress disorder)    Prediabetes    Depression    History of IBS    Chronic headaches   [2]   Past Medical History:  Diagnosis Date    Acid reflux     Anxiety     B12 deficiency     Back problem     Benign paroxysmal positional vertigo     Cervical disc disease     Chronic headaches     Depression     Diarrhea     GAD (generalized anxiety disorder)     History of IBS     Iron deficiency anemia, unspecified     Irritable bowel syndrome     Low magnesium  level     Neck problem     OCD (obsessive compulsive disorder)     Panic attacks     Prediabetes  PTSD (post-traumatic stress disorder)     TMJ (dislocation of temporomandibular joint)     Type 2 diabetes mellitus     Vitamin B 12 deficiency     Vitamin D  deficiency     Wears glasses    [3]   Past Surgical History:  Procedure Laterality Date    COLONOSCOPY N/A  2021    Dr.Patel    ESOPHAGOGASTRODUODENOSCOPY  2021    HX HYSTERECTOMY N/A 07/2020    HX TUBAL LIGATION     [4]   Family History  Problem Relation Name Age of Onset    Hypertension (High Blood Pressure) Mother      Anxiety Mother      Diabetes Mother      Depression Mother      Anxiety Father      Depression Father      Diabetes type II Father      Alcohol  abuse Father      Melanoma Father      Anxiety Brother      Depression Brother      Other Brother          SUD on methadone     Suicidality Maternal Uncle      Hypertension (High Blood Pressure) Maternal Grandmother      Alcohol  abuse Maternal Grandfather      Hypertension (High Blood Pressure) Paternal Grandmother      Alcohol  abuse Paternal Grandfather      Anxiety Daughter          age 35 in 2023    No Known Problems Daughter          Age 42 in 2023    No Known Problems Daughter          age 70 in 2023    Coronary Artery Disease Other          Maternal uncle

## 2024-05-22 ENCOUNTER — Ambulatory Visit: Payer: Self-pay

## 2024-05-22 ENCOUNTER — Other Ambulatory Visit: Payer: Self-pay

## 2024-05-22 VITALS — BP 117/75 | HR 78 | Resp 18 | Ht 63.0 in | Wt 186.0 lb

## 2024-05-22 DIAGNOSIS — Z79899 Other long term (current) drug therapy: Secondary | ICD-10-CM

## 2024-05-22 DIAGNOSIS — F410 Panic disorder [episodic paroxysmal anxiety] without agoraphobia: Secondary | ICD-10-CM | POA: Insufficient documentation

## 2024-05-22 DIAGNOSIS — F3181 Bipolar II disorder: Secondary | ICD-10-CM | POA: Insufficient documentation

## 2024-05-22 DIAGNOSIS — F4310 Post-traumatic stress disorder, unspecified: Secondary | ICD-10-CM | POA: Insufficient documentation

## 2024-05-22 DIAGNOSIS — F429 Obsessive-compulsive disorder, unspecified: Secondary | ICD-10-CM | POA: Insufficient documentation

## 2024-05-22 DIAGNOSIS — F411 Generalized anxiety disorder: Secondary | ICD-10-CM | POA: Insufficient documentation

## 2024-05-22 DIAGNOSIS — F319 Bipolar disorder, unspecified: Secondary | ICD-10-CM

## 2024-05-22 MED ORDER — ESCITALOPRAM 20 MG TABLET
20.0000 mg | ORAL_TABLET | Freq: Every day | ORAL | 0 refills | Status: DC
Start: 2024-05-22 — End: 2024-05-29

## 2024-05-22 MED ORDER — LAMOTRIGINE 100 MG TABLET
100.0000 mg | ORAL_TABLET | Freq: Two times a day (BID) | ORAL | 0 refills | Status: DC
Start: 2024-05-22 — End: 2024-06-20

## 2024-05-22 MED ORDER — RISPERIDONE 2 MG TABLET
2.0000 mg | ORAL_TABLET | Freq: Every day | ORAL | 0 refills | Status: DC
Start: 2024-05-22 — End: 2024-06-20

## 2024-05-22 MED ORDER — LORAZEPAM 1 MG TABLET
1.0000 mg | ORAL_TABLET | Freq: Two times a day (BID) | ORAL | 0 refills | Status: DC | PRN
Start: 2024-05-22 — End: 2024-06-20

## 2024-05-22 NOTE — Patient Instructions (Addendum)
 Escitalopram  Oxalate 20 mg take 1 tab by mouth daily for anxiety, Depression, PTSD and OCD  Lorazepam  1 mg take 1 tab by mouth twice daily as needed for anxiety and panic   Lamictal  100 mg take by mouth twice daily  Increase Risperidone  from 1 mg to 2 mg  by mouth daily

## 2024-05-22 NOTE — Progress Notes (Signed)
 BEHAVIORAL MEDICINE[facility] [place]  [address]  [place] [address]  Operated by [facility]    Name: Latoya Harrison MRN:  [id number]   Date: 05/22/2024 DOB:  1975/12/11 (48 y.o.)       Chief Complaint: Bipolar Disorder, Major Depression, and Generalized Anxiety    Subjective:   Patient reports that she may have gout. Reports that she has been on edge a bit more the past couple of days.ad surgery for removal of cyst on buttocks. Did well. Went to [employer] last Saturday to celebrate her birthday. Oldest daughter has gotten settled after her move to [place].    Mood Not great due to pain.  Medications working well. No ill effects  Appetite Ok. Up and down. On Mounjaro .   Sleep Didn't sleep well due to pain in feet.  Energy Up and down  Stressors Daughter is going back to school tomorrow.    Problem List[1]  Past Medical History[2]  Past Surgical History[3]  Family History[4]  Social History     Socioeconomic History    Marital status: Married   Tobacco Use    Smoking status: Every Day     Current packs/day: 1.00     Types: Cigarettes     Passive exposure: Never    Smokeless tobacco: Never   Vaping Use    Vaping status: Some Days    Substances: Nicotine, Flavoring   Substance and Sexual Activity    Alcohol  use: Not Currently     Comment: occasionally    Drug use: Never   Other Topics Concern    Ability to Walk 1 Flight of Steps without SOB/CP Yes    Routine Exercise Yes    Ability to Walk 2 Flight of Steps without SOB/CP No    Unable to Ambulate No    Total Care No    Ability To Do Own ADL's Yes    Uses Walker No    Other Activity Level Yes    Uses Cane No      Levaquin [levofloxacin] and Pertussis vaccine,adsorbed   Current Outpatient Medications   Medication Sig    atorvastatin  (LIPITOR) 10 mg Oral Tablet Take 1 tablet by mouth once daily    Blood Sugar Diagnostic (BLOOD GLUCOSE TEST) Strip Use as directed to check blood sugar twice daily and as needed.     ergocalciferol , vitamin D2, (DRISDOL ) 1,250 mcg (50,000 unit) Oral Capsule Take 1 capsule by mouth once a week    escitalopram  oxalate (LEXAPRO ) 20 mg Oral Tablet Take 1 Tablet (20 mg total) by mouth Daily Indications: bipolar depression    famotidine  (PEPCID ) 40 mg Oral Tablet Take 1 tablet by mouth twice daily    Ibuprofen (MOTRIN) 800 mg Oral Tablet Take 1 Tablet (800 mg total) by mouth Three times a day as needed    lamotrigine  (LAMICTAL ) 100 mg Oral Tablet Take 1 Tablet (100 mg total) by mouth Twice daily Indications: bipolar depression    lancets (UNIVERSAL 1 LANCETS) 26 gauge Misc Fingersticks 2 times daily and as needed.    LORazepam  (ATIVAN ) 1 mg Oral Tablet Take 1 Tablet (1 mg total) by mouth Twice per day as needed for Anxiety    magnesium  Oxide 500 mg magnesium  Oral Tablet Take 1 Tablet (500 mg total) by mouth Daily    meclizine  (ANTIVERT ) 25 mg Oral Tablet Take 1 Tablet (25 mg total) by mouth Every 8 hours as needed    omeprazole  (PRILOSEC) 40  mg Oral Capsule, Delayed Release(E.C.) Take 1 capsule by mouth once daily    PERCOCET 5-325 mg Oral Tablet Take 1 Tablet by mouth Every 4 hours as needed for Pain    propranoloL  (INDERAL  LA) 120 mg Oral Capsule,Sustained Action 24 hr Take 1 Capsule (120 mg total) by mouth Once a day for 90 days    risperiDONE  (RISPERDAL ) 0.5 mg Oral Tablet Take 1 Tablet (0.5 mg total) by mouth Daily for 14 days. Then take 2 tabs (1 mg total) daily.    tirzepatide  (MOUNJARO ) 5 mg/0.5 mL Subcutaneous Pen Injector Inject 0.5 mL (5 mg total) under the skin Every 7 days Indications: type 2 diabetes mellitus        Objective :  There were no vitals taken for this visit.      PHQ Total Score                      01/24/2024     9:06 AM 03/13/2024    10:10 AM 04/24/2024    10:05 AM   Most Recent PHQ-9 Scores   PHQ 9 Total 16 17 13           Mental Status Exam  AXOX4. Casual dress, calm, well-groomed. No SI, HI, AVH, delusions, or paranoia. Thoughts are logical, coherent, and goal-directed.  Good eye contact. Speech is normal in rate and tone. Mood is okay affect congruent. No psychomotor agitation or retardation, cogwheel rigidity, or abnormal movements. Gait is normal. Attention, concentration, and memory are good. No cognitive deficits noted. Judgment and insight are fair. Calculation and abstraction are within normal limits.     Data Reviewed  I have reviewed patient's previous note medical, surgical, family, and social history in detail today,     Assessment  Bipolar Disorder, Generalized Anxiety, Panic Disorder, PTSD, and OCD     Plan    Escitalopram  Oxalate 20 mg take 1 tab by mouth daily for anxiety, Depression, PTSD and OCD  Lorazepam  1 mg take 1 tab by mouth twice daily as needed for anxiety and panic (PMP- filled on 04/01/24, 60 tab, 30 day supply, no refills).  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)    PTSD (post-traumatic stress disorder)    Prediabetes    Depression    History of IBS    Chronic headaches   [2]   Past Medical History:  Diagnosis Date    Acid reflux     Anxiety     B12 deficiency     Back problem     Benign paroxysmal positional vertigo     Cervical disc disease     Chronic headaches     Depression     Diarrhea     GAD (generalized anxiety disorder)     History of IBS     Iron deficiency anemia, unspecified     Irritable bowel syndrome  Low magnesium  level     Neck problem     OCD (obsessive compulsive disorder)     Panic attacks     Prediabetes     PTSD (post-traumatic stress disorder)     TMJ (dislocation of temporomandibular joint)      Type 2 diabetes mellitus     Vitamin B 12 deficiency     Vitamin D  deficiency     Wears glasses    [3]   Past Surgical History:  Procedure Laterality Date    COLONOSCOPY N/A 2021    Dr.Patel    ESOPHAGOGASTRODUODENOSCOPY  2021    HX HYSTERECTOMY N/A 07/2020    HX TUBAL LIGATION     [4]   Family History  Problem Relation Name Age of Onset    Hypertension (High Blood Pressure) Mother      Anxiety Mother      Diabetes Mother      Depression Mother      Anxiety Father      Depression Father      Diabetes type II Father      Alcohol  abuse Father      Melanoma Father      Anxiety Brother      Depression Brother      Other Brother          SUD on methadone     Suicidality Maternal Uncle      Hypertension (High Blood Pressure) Maternal Grandmother      Alcohol  abuse Maternal Grandfather      Hypertension (High Blood Pressure) Paternal Grandmother      Alcohol  abuse Paternal Grandfather      Anxiety Daughter          age 7 in 2023    No Known Problems Daughter          Age 69 in 2023    No Known Problems Daughter          age 65 in 2023    Coronary Artery Disease Other          Maternal uncle

## 2024-05-23 ENCOUNTER — Encounter (INDEPENDENT_AMBULATORY_CARE_PROVIDER_SITE_OTHER): Payer: Self-pay | Admitting: PHYSICIAN ASSISTANT

## 2024-05-23 ENCOUNTER — Ambulatory Visit: Payer: Self-pay | Attending: PHYSICIAN ASSISTANT | Admitting: PHYSICIAN ASSISTANT

## 2024-05-23 VITALS — BP 104/70 | HR 86 | Temp 98.0°F | Resp 16 | Ht 63.0 in | Wt 188.0 lb

## 2024-05-23 DIAGNOSIS — M109 Gout, unspecified: Secondary | ICD-10-CM | POA: Insufficient documentation

## 2024-05-23 MED ORDER — METHYLPREDNISOLONE 4 MG TABLETS IN A DOSE PACK
ORAL_TABLET | ORAL | 0 refills | Status: DC
Start: 2024-05-23 — End: 2024-06-20

## 2024-05-23 MED ORDER — COLCHICINE 0.6 MG TABLET
0.6000 mg | ORAL_TABLET | Freq: Every day | ORAL | 0 refills | Status: DC
Start: 2024-05-23 — End: 2024-05-29

## 2024-05-23 MED ORDER — DEXAMETHASONE SODIUM PHOSPHATE 4 MG/ML INJECTION SOLUTION
4.0000 mg | INTRAMUSCULAR | Status: AC
Start: 2024-05-23 — End: 2024-05-23
  Administered 2024-05-23: 4 mg via INTRAMUSCULAR

## 2024-05-23 MED ORDER — KETOROLAC 60 MG/2 ML INTRAMUSCULAR SOLUTION
60.0000 mg | Freq: Once | INTRAMUSCULAR | Status: AC
Start: 2024-05-23 — End: 2024-05-23
  Administered 2024-05-23: 60 mg via INTRAMUSCULAR

## 2024-05-23 NOTE — Progress Notes (Signed)
 FAMILY MEDICINE, MEDICAL OFFICE BUILDING  [address]  [place] [address]  Operated by [facility]    MIKAELYN ARTHURS  1976-07-12  [id number]    Date of Service: 05/23/2024 10:00 AM EDT    Chief complaint:   Chief Complaint   Patient presents with    Foot Pain    Foot Swelling       Subjective:      48 y.o. year old female who comes in today for R foot pain and swelling x 2 days. Worse at night. Red and hot to touch. Has taken ibuprofen, naproxen  without relief. Took some leftover opiate with some improvement. Denies injury/trauma. She does have family hx of gout but no personal hx.     ROS:  Reviewed as negative unless otherwise mentioned in the HPI.    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) 08/02/2023    PTSD (post-traumatic stress disorder) 08/02/2023    Bipolar II disorder (CMS HCC) 06/07/2023    Obsessive-compulsive disorder 06/07/2023    Panic attacks 08/05/2022    Acid reflux 03/18/2022    GAD (generalized anxiety disorder) 03/18/2022    Vitamin D  deficiency 03/18/2022    Vitamin B 12 deficiency 03/18/2022       Past Surgical History:   Procedure Laterality Date    COLONOSCOPY N/A 2021    Dr.Patel    ESOPHAGOGASTRODUODENOSCOPY  2021    HX HYSTERECTOMY N/A 07/2020    HX TUBAL LIGATION             Current Outpatient Medications   Medication Sig    atorvastatin  (LIPITOR) 10 mg Oral Tablet Take 1 tablet by mouth once daily    Blood Sugar Diagnostic (BLOOD GLUCOSE TEST) Strip Use as directed to check blood sugar twice daily and as needed.    colchicine  0.6 mg Oral Tablet Take 1 Tablet (0.6 mg total) by mouth Daily for 7 days    cyanocobalamin  (VITAMIN B12) 1,000 mcg/mL Injection Solution Inject 1 mL (1,000 mcg total)  under the skin Every 7 days for 90 days    ergocalciferol , vitamin D2, (DRISDOL ) 1,250 mcg (50,000 unit) Oral Capsule Take 1 capsule by mouth once a week    escitalopram  oxalate (LEXAPRO ) 20 mg Oral Tablet Take 1 Tablet (20 mg total) by mouth Daily Indications: bipolar depression    famotidine  (PEPCID ) 40 mg Oral Tablet Take 1 tablet by mouth twice daily    Ibuprofen (MOTRIN) 800 mg Oral Tablet Take 1 Tablet (800 mg total) by mouth Three times a day as needed    lamotrigine  (LAMICTAL ) 100 mg Oral Tablet Take 1 Tablet (100 mg total) by mouth Twice daily Indications: bipolar depression    lancets (UNIVERSAL 1 LANCETS) 26 gauge Misc Fingersticks 2 times daily and as needed.    LORazepam  (ATIVAN ) 1 mg Oral Tablet Take 1 Tablet (1 mg total) by mouth Twice per day as needed for Anxiety    magnesium  Oxide 500 mg magnesium  Oral Tablet Take 1 Tablet (500 mg total) by mouth Daily    meclizine  (ANTIVERT ) 25 mg Oral Tablet Take 1 Tablet (25 mg total) by mouth Every 8 hours as  needed    Methylprednisolone  (MEDROL  DOSEPACK) 4 mg Oral Tablets, Dose Pack Take as instructed.    omeprazole  (PRILOSEC) 40 mg Oral Capsule, Delayed Release(E.C.) Take 1 capsule by mouth once daily    PERCOCET 5-325 mg Oral Tablet Take 1 Tablet by mouth Every 4 hours as needed for Pain    propranoloL  (INDERAL  LA) 120 mg Oral Capsule,Sustained Action 24 hr Take 1 Capsule (120 mg total) by mouth Once a day for 90 days    risperiDONE  (RISPERDAL ) 2 mg Oral Tablet Take 1 Tablet (2 mg total) by mouth Daily for 30 days for 14 days. Then take 2 tabs (1 mg total) daily.    tirzepatide  (MOUNJARO ) 5 mg/0.5 mL Subcutaneous Pen Injector Inject 0.5 mL (5 mg total) under the skin Every 7 days Indications: type 2 diabetes mellitus       Objective:     BP 104/70   Pulse 86   Temp 36.7 C (98 F) (Temporal)   Resp 16   Ht 1.6 m (5' 3)   Wt 85.3 kg (188 lb)   SpO2 95%   BMI 33.30 kg/m     General appearance: alert, cooperative, in no acute distress  Head:  normocephalic, atraumatic.   Eyes: PERRL, EOMI.   Lungs: breathing unlabored  Heart: regular rate and rhythm  Extremities: intact x 4. Significant edema noted of the toes particularly the 1st digit of the R foot. There is warmth and redness noted at the mtp joint of the 1st digit.   Psych: alert and oriented x 3  Neuro: CN 2-12 grossly intact    Assessment/Plan     Assessment & Plan  Acute gout  Can continue to take ibuprofen or naproxen  as needed.   Orders:    dexAMETHasone  4 mg/mL injection    ketorolac  (TORADOL ) 60mg /2 mL IM injection    Methylprednisolone  (MEDROL  DOSEPACK) 4 mg Oral Tablets, Dose Pack; Take as instructed.    colchicine  0.6 mg Oral Tablet; Take 1 Tablet (0.6 mg total) by mouth Daily for 7 days                     The patient was given the opportunity to ask questions and those questions were answered to the patient's satisfaction. The patient was encouraged to call with any additional questions or concerns. Instructed patient to call back if symptoms worse.   Discussed with patient effects and side effects of medications. Medication safety was discussed. A copy of the patient's medication list was printed and given to the patient. A good faith effort was made to reconcile the patient's medications.       Follow up: Return if symptoms worsen or fail to improve.    Derrek Patricia, PA-C

## 2024-05-29 ENCOUNTER — Other Ambulatory Visit: Payer: Self-pay

## 2024-05-29 ENCOUNTER — Ambulatory Visit (INDEPENDENT_AMBULATORY_CARE_PROVIDER_SITE_OTHER): Payer: Self-pay | Admitting: PHYSICIAN ASSISTANT

## 2024-05-29 ENCOUNTER — Encounter (INDEPENDENT_AMBULATORY_CARE_PROVIDER_SITE_OTHER): Payer: Self-pay | Admitting: PHYSICIAN ASSISTANT

## 2024-05-29 MED ORDER — COLCHICINE 0.6 MG TABLET
0.6000 mg | ORAL_TABLET | Freq: Every day | ORAL | 0 refills | Status: AC
Start: 2024-05-29 — End: 2024-06-20

## 2024-05-29 MED ORDER — FAMOTIDINE 40 MG TABLET
40.0000 mg | ORAL_TABLET | Freq: Two times a day (BID) | ORAL | 0 refills | Status: AC
Start: 2024-05-29 — End: ?

## 2024-05-29 MED ORDER — ERGOCALCIFEROL (VITAMIN D2) 1,250 MCG (50,000 UNIT) CAPSULE
50000.0000 [IU] | ORAL_CAPSULE | ORAL | 0 refills | Status: AC
Start: 2024-05-29 — End: ?

## 2024-05-29 MED ORDER — ESCITALOPRAM 20 MG TABLET
20.0000 mg | ORAL_TABLET | Freq: Every day | ORAL | 0 refills | Status: DC
Start: 2024-05-29 — End: 2024-06-20

## 2024-05-29 MED ORDER — MECLIZINE 25 MG TABLET
25.0000 mg | ORAL_TABLET | Freq: Three times a day (TID) | ORAL | 0 refills | Status: AC | PRN
Start: 2024-05-29 — End: ?

## 2024-05-29 MED ORDER — PROPRANOLOL ER 120 MG CAPSULE,24 HR,EXTENDED RELEASE: 120.0000 mg | ORAL_CAPSULE | Freq: Every day | ORAL | 0 refills | Status: AC

## 2024-05-29 MED ORDER — CYANOCOBALAMIN (VIT B-12) 1,000 MCG/ML INJECTION SOLUTION
1000.0000 ug | INTRAMUSCULAR | 0 refills | Status: AC
Start: 2024-05-29 — End: 2024-08-27

## 2024-05-29 MED ORDER — MOUNJARO 5 MG/0.5 ML SUBCUTANEOUS PEN INJECTOR
5.0000 mg | PEN_INJECTOR | SUBCUTANEOUS | 1 refills | Status: AC
Start: 2024-05-29 — End: ?

## 2024-05-29 MED ORDER — ATORVASTATIN 10 MG TABLET
10.0000 mg | ORAL_TABLET | Freq: Every day | ORAL | 0 refills | Status: AC
Start: 2024-05-29 — End: ?

## 2024-05-29 MED ORDER — OMEPRAZOLE 40 MG CAPSULE,DELAYED RELEASE
40.0000 mg | DELAYED_RELEASE_CAPSULE | Freq: Every day | ORAL | 0 refills | Status: AC
Start: 2024-05-29 — End: ?

## 2024-05-29 NOTE — Assessment & Plan Note (Signed)
 Orders:    famotidine  (PEPCID ) 40 mg Oral Tablet; Take 1 Tablet (40 mg total) by mouth Twice daily    omeprazole  (PRILOSEC) 40 mg Oral Capsule, Delayed Release(E.C.); Take 1 Capsule (40 mg total) by mouth Daily

## 2024-05-29 NOTE — Progress Notes (Unsigned)
 FAMILY MEDICINE, MEDICAL OFFICE BUILDING  [address]  [place] [address]  Operated by [facility]    Progress Note    Latoya Harrison  [birth year]  [id number]    Date of Service: 05/29/2024 10:00 AM EDT    Chief complaint:   Chief Complaint   Patient presents with    Follow Up     6 week follow up  Wants to talk about gout       Subjective:     This is a case of a 48 y.o. year old female who comes in today for ***      Review of Systems   All systems reviewed and negative except for what is noted above.         Current Outpatient Medications   Medication Sig    atorvastatin  (LIPITOR) 10 mg Oral Tablet Take 1 Tablet (10 mg total) by mouth Daily    Blood Sugar Diagnostic (BLOOD GLUCOSE TEST) Strip Use as directed to check blood sugar twice daily and as needed.    colchicine  0.6 mg Oral Tablet Take 1 Tablet (0.6 mg total) by mouth Daily for 7 days    cyanocobalamin  (VITAMIN B12) 1,000 mcg/mL Injection Solution Inject 1 mL (1,000 mcg total) under the skin Every 7 days for 90 days    ergocalciferol , vitamin D2, (DRISDOL ) 1,250 mcg (50,000 unit) Oral Capsule Take 1 Capsule (50,000 Units total) by mouth Every 7 days    escitalopram  oxalate (LEXAPRO ) 20 mg Oral Tablet Take 1 Tablet (20 mg total) by mouth Daily Indications: bipolar depression    famotidine  (PEPCID ) 40 mg Oral Tablet Take 1 Tablet (40 mg total) by mouth Twice daily    Ibuprofen (MOTRIN) 800 mg Oral Tablet Take 1 Tablet (800 mg total) by mouth Three times a day as needed    lamotrigine  (LAMICTAL ) 100 mg Oral Tablet Take 1 Tablet (100 mg total) by mouth Twice daily Indications: bipolar depression    lancets (UNIVERSAL 1 LANCETS) 26 gauge Misc Fingersticks 2 times daily and as needed.    LORazepam  (ATIVAN ) 1 mg Oral Tablet Take 1 Tablet (1 mg total) by mouth Twice per day as needed for Anxiety    magnesium  Oxide 500 mg magnesium  Oral Tablet Take 1 Tablet (500 mg total) by mouth Daily    meclizine  (ANTIVERT ) 25 mg Oral Tablet Take 1  Tablet (25 mg total) by mouth Every 8 hours as needed    Methylprednisolone  (MEDROL  DOSEPACK) 4 mg Oral Tablets, Dose Pack Take as instructed. (Patient not taking: Reported on 05/29/2024)    omeprazole  (PRILOSEC) 40 mg Oral Capsule, Delayed Release(E.C.) Take 1 Capsule (40 mg total) by mouth Daily    PERCOCET 5-325 mg Oral Tablet Take 1 Tablet by mouth Every 4 hours as needed for Pain    propranoloL  (INDERAL  LA) 120 mg Oral Capsule,Sustained Action 24 hr Take 1 Capsule (120 mg total) by mouth Daily for 90 days    risperiDONE  (RISPERDAL ) 2 mg Oral Tablet Take 1 Tablet (2 mg total) by mouth Daily for 30 days for 14 days. Then take 2 tabs (1 mg total) daily.    tirzepatide  (MOUNJARO ) 5 mg/0.5 mL Subcutaneous Pen Injector Inject 0.5 mL (5 mg total) under the skin Every 7 days Indications: type 2 diabetes mellitus       Objective:     BP 103/70   Pulse 65   Temp 36.6 C (97.8 F) (Temporal)   Ht 1.6 m (5' 3)   Wt  84.4 kg (186 lb)   SpO2 96%   BMI 32.95 kg/m       BMI addressed: {BMI Addressed:24238}     Physical Exam      Assessment & Plan  Acute gout    Orders:    colchicine  0.6 mg Oral Tablet; Take 1 Tablet (0.6 mg total) by mouth Daily for 7 days    B12 deficiency    Orders:    cyanocobalamin  (VITAMIN B12) 1,000 mcg/mL Injection Solution; Inject 1 mL (1,000 mcg total) under the skin Every 7 days for 90 days    Vitamin D  deficiency    Orders:    ergocalciferol , vitamin D2, (DRISDOL ) 1,250 mcg (50,000 unit) Oral Capsule; Take 1 Capsule (50,000 Units total) by mouth Every 7 days    Gastroesophageal reflux disease, unspecified whether esophagitis present    Orders:    famotidine  (PEPCID ) 40 mg Oral Tablet; Take 1 Tablet (40 mg total) by mouth Twice daily    omeprazole  (PRILOSEC) 40 mg Oral Capsule, Delayed Release(E.C.); Take 1 Capsule (40 mg total) by mouth Daily       Other orders    atorvastatin  (LIPITOR) 10 mg Oral Tablet; Take 1 Tablet (10 mg total) by mouth Daily    escitalopram  oxalate (LEXAPRO ) 20 mg Oral  Tablet; Take 1 Tablet (20 mg total) by mouth Daily Indications: bipolar depression    meclizine  (ANTIVERT ) 25 mg Oral Tablet; Take 1 Tablet (25 mg total) by mouth Every 8 hours as needed    propranoloL  (INDERAL  LA) 120 mg Oral Capsule,Sustained Action 24 hr; Take 1 Capsule (120 mg total) by mouth Daily for 90 days    tirzepatide  (MOUNJARO ) 5 mg/0.5 mL Subcutaneous Pen Injector; Inject 0.5 mL (5 mg total) under the skin Every 7 days Indications: type 2 diabetes mellitus          The patient was given the opportunity to ask questions and those questions were answered to the patient's satisfaction. The patient was encouraged to call with any additional questions or concerns.    Follow up: No follow-ups on file.    Gerardine Ammon, PA-C

## 2024-05-29 NOTE — Assessment & Plan Note (Signed)
 Orders:    ergocalciferol, vitamin D2, (DRISDOL) 1,250 mcg (50,000 unit) Oral Capsule; Take 1 Capsule (50,000 Units total) by mouth Every 7 days

## 2024-05-30 ENCOUNTER — Other Ambulatory Visit (INDEPENDENT_AMBULATORY_CARE_PROVIDER_SITE_OTHER): Payer: Self-pay

## 2024-06-04 ENCOUNTER — Other Ambulatory Visit (INDEPENDENT_AMBULATORY_CARE_PROVIDER_SITE_OTHER): Payer: Self-pay

## 2024-06-20 ENCOUNTER — Ambulatory Visit: Payer: Self-pay

## 2024-06-20 VITALS — BP 115/74 | HR 81 | Resp 20 | Ht 63.0 in | Wt 186.0 lb

## 2024-06-20 DIAGNOSIS — F411 Generalized anxiety disorder: Secondary | ICD-10-CM

## 2024-06-20 DIAGNOSIS — F3181 Bipolar II disorder: Secondary | ICD-10-CM | POA: Insufficient documentation

## 2024-06-20 DIAGNOSIS — F4310 Post-traumatic stress disorder, unspecified: Secondary | ICD-10-CM

## 2024-06-20 DIAGNOSIS — F429 Obsessive-compulsive disorder, unspecified: Secondary | ICD-10-CM

## 2024-06-20 DIAGNOSIS — F410 Panic disorder [episodic paroxysmal anxiety] without agoraphobia: Secondary | ICD-10-CM

## 2024-06-20 MED ORDER — LORAZEPAM 1 MG TABLET: 1.0000 mg | ORAL_TABLET | Freq: Two times a day (BID) | ORAL | 0 refills | Status: DC | PRN

## 2024-06-20 MED ORDER — ESCITALOPRAM 20 MG TABLET: 20.0000 mg | ORAL_TABLET | Freq: Every day | ORAL | 0 refills | Status: DC

## 2024-06-20 MED ORDER — LAMOTRIGINE 100 MG TABLET: 100.0000 mg | ORAL_TABLET | Freq: Two times a day (BID) | ORAL | 0 refills | Status: DC

## 2024-06-20 MED ORDER — RISPERIDONE 2 MG TABLET
2.0000 mg | ORAL_TABLET | Freq: Every day | ORAL | 0 refills | Status: DC
Start: 2024-06-20 — End: 2024-07-22

## 2024-06-20 NOTE — Progress Notes (Signed)
 BEHAVIORAL MEDICINE, [facility]  [address]  [place] [address]  Operated by [facility]    Name: Latoya Harrison MRN:  [id number]   Date: 06/20/2024 DOB:  07-01-1976 (48 y.o.)       Chief Complaint: Bipolar Disorder and Generalized Anxiety    Subjective:   Patient reports that she has been diagnosed with gout and has limited her meat intake. Patient reports that her daughter's best friends little sister who is 86 years old, killed herself 3 years ago. This person had tried to kill herself 5 years ago. Patient reports that this family is like her family, it's been rough on all of us . Daughter is in [place] and can't come home to support her friend. 53 year old daughter had her first anxiety attack yesterday.    Mood Pretty good  Medications Thinks meds are working well. No ill effects.  Appetite  some days it's a little off.  Sleep so so. I have my nights.  Energy Had some really bad days last week, it could be because of the death.  Stressors Nothing major    Problem List[1]  Past Medical History[2]  Past Surgical History[3]  Family History[4]  Social History     Socioeconomic History    Marital status: Married   Tobacco Use    Smoking status: Every Day     Current packs/day: 1.00     Types: Cigarettes     Passive exposure: Never    Smokeless tobacco: Never   Vaping Use    Vaping status: Some Days    Substances: Nicotine, Flavoring   Substance and Sexual Activity    Alcohol  use: Not Currently     Comment: occasionally    Drug use: Never   Other Topics Concern    Ability to Walk 1 Flight of Steps without SOB/CP Yes    Routine Exercise Yes    Ability to Walk 2 Flight of Steps without SOB/CP No    Unable to Ambulate No    Total Care No    Ability To Do Own ADL's Yes    Uses Walker No    Other Activity Level Yes    Uses Cane No      Levaquin [levofloxacin] and Pertussis vaccine,adsorbed   Current Outpatient Medications   Medication Sig     atorvastatin  (LIPITOR) 10 mg Oral Tablet Take 1 Tablet (10 mg total) by mouth Daily    Blood Sugar Diagnostic (BLOOD GLUCOSE TEST) Strip Use as directed to check blood sugar twice daily and as needed.    colchicine  0.6 mg Oral Tablet Take 1 Tablet (0.6 mg total) by mouth Daily for 7 days    cyanocobalamin  (VITAMIN B12) 1,000 mcg/mL Injection Solution Inject 1 mL (1,000 mcg total) under the skin Every 7 days for 90 days    ergocalciferol , vitamin D2, (DRISDOL ) 1,250 mcg (50,000 unit) Oral Capsule Take 1 Capsule (50,000 Units total) by mouth Every 7 days    escitalopram  oxalate (LEXAPRO ) 20 mg Oral Tablet Take 1 Tablet (20 mg total) by mouth Daily Indications: bipolar depression    famotidine  (PEPCID ) 40 mg Oral Tablet Take 1 Tablet (40 mg total) by mouth Twice daily    Ibuprofen (MOTRIN) 800 mg Oral Tablet Take 1 Tablet (800 mg total) by mouth Three times a day as needed    lamotrigine  (LAMICTAL ) 100 mg Oral Tablet Take 1 Tablet (100 mg total) by mouth Twice daily Indications: bipolar depression  lancets (UNIVERSAL 1 LANCETS) 26 gauge Misc Fingersticks 2 times daily and as needed.    LORazepam  (ATIVAN ) 1 mg Oral Tablet Take 1 Tablet (1 mg total) by mouth Twice per day as needed for Anxiety    magnesium  Oxide 500 mg magnesium  Oral Tablet Take 1 Tablet (500 mg total) by mouth Daily    meclizine  (ANTIVERT ) 25 mg Oral Tablet Take 1 Tablet (25 mg total) by mouth Every 8 hours as needed    Methylprednisolone  (MEDROL  DOSEPACK) 4 mg Oral Tablets, Dose Pack Take as instructed. (Patient not taking: Reported on 05/29/2024)    omeprazole  (PRILOSEC) 40 mg Oral Capsule, Delayed Release(E.C.) Take 1 Capsule (40 mg total) by mouth Daily    PERCOCET 5-325 mg Oral Tablet Take 1 Tablet by mouth Every 4 hours as needed for Pain (Patient not taking: Reported on 06/20/2024)    propranoloL  (INDERAL  LA) 120 mg Oral Capsule,Sustained Action 24 hr Take 1 Capsule (120 mg total) by mouth Daily for 90 days    risperiDONE  (RISPERDAL ) 2 mg Oral  Tablet Take 1 Tablet (2 mg total) by mouth Daily for 30 days for 14 days. Then take 2 tabs (1 mg total) daily.    tirzepatide  (MOUNJARO ) 5 mg/0.5 mL Subcutaneous Pen Injector Inject 0.5 mL (5 mg total) under the skin Every 7 days Indications: type 2 diabetes mellitus        Objective :  BP 115/74   Pulse 81   Resp 20   Ht 1.6 m (5' 3)   Wt 84.4 kg (186 lb)   BMI 32.95 kg/m       PHQ Total Score  PHQ 2 Total: 4  PHQ 9 Total: 17  Interpretation of Total Score: 15-19 Moderate/Severe depression             04/24/2024    10:05 AM 05/22/2024    10:07 AM 06/20/2024    10:00 AM   Most Recent PHQ-9 Scores   PHQ 9 Total 13 17 17            Mental Status Exam  AXOX4. Casual dress, calm, well-groomed. No SI, HI, AVH, delusions, or paranoia. Thoughts are logical, coherent, and goal-directed. Good eye contact. Speech is normal in rate and tone. Mood is okay affect congruent. No psychomotor agitation or retardation, cogwheel rigidity, or abnormal movements. Gait is normal. Attention, concentration, and memory are good. No cognitive deficits noted. Judgment and insight are fair. Calculation and abstraction are within normal limits.     Data Reviewed  I have reviewed patient's previous note medical, surgical, family, and social history in detail today,     Assessment  Bipolar II Disorder, Generalized Anxiety, Panic Disorder, PTSD, and OCD     Plan  Escitalopram  Oxalate 20 mg take 1 tab by mouth daily for anxiety, Depression, PTSD and OCD  Lorazepam  1 mg take 1 tab by mouth twice daily as needed for anxiety and panic   Lamictal  100 mg take by mouth twice daily  Risperidone  2 mg  by mouth daily      Follow up  Follow up in 8 weeks     Jon Apa, APRN,PMHNP-BC        [1]   Patient Active Problem List  Diagnosis    Acid reflux    GAD (generalized anxiety disorder)    Vitamin D  deficiency    Vitamin B 12 deficiency    Panic attacks    Bipolar II disorder (CMS HCC)    Obsessive-compulsive disorder  Moderate episode of  recurrent major depressive disorder (CMS HCC)    PTSD (post-traumatic stress disorder)    Prediabetes    Depression    History of IBS    Chronic headaches    Cervical disc disorder    Hidradenitis suppurativa    History of anemia   [2]   Past Medical History:  Diagnosis Date    Acid reflux     Anxiety     B12 deficiency     Back problem     Benign paroxysmal positional vertigo     Cervical disc disease     Chronic headaches     Depression     Diarrhea     GAD (generalized anxiety disorder)     History of IBS     Iron deficiency anemia, unspecified     Irritable bowel syndrome     Low magnesium  level     Neck problem     OCD (obsessive compulsive disorder)     Panic attacks     Prediabetes     PTSD (post-traumatic stress disorder)     TMJ (dislocation of temporomandibular joint)     Type 2 diabetes mellitus     Vitamin B 12 deficiency     Vitamin D  deficiency     Wears glasses    [3]   Past Surgical History:  Procedure Laterality Date    COLONOSCOPY N/A 2021    Dr.Patel    ESOPHAGOGASTRODUODENOSCOPY  2021    HX HYSTERECTOMY N/A 07/2020    HX TUBAL LIGATION     [4]   Family History  Problem Relation Name Age of Onset    Hypertension (High Blood Pressure) Mother      Anxiety Mother      Diabetes Mother      Depression Mother      Anxiety Father      Depression Father      Diabetes type II Father      Alcohol  abuse Father      Melanoma Father      Anxiety Brother      Depression Brother      Other Brother          SUD on methadone     Suicidality Maternal Uncle      Hypertension (High Blood Pressure) Maternal Grandmother      Alcohol  abuse Maternal Grandfather      Hypertension (High Blood Pressure) Paternal Grandmother      Alcohol  abuse Paternal Grandfather      Anxiety Daughter          age 18 in 2023    No Known Problems Daughter          Age 20 in 2023    No Known Problems Daughter          age 103 in 2023    Coronary Artery Disease Other          Maternal uncle

## 2024-07-22 ENCOUNTER — Other Ambulatory Visit (HOSPITAL_PSYCHIATRIC): Payer: Self-pay

## 2024-08-20 ENCOUNTER — Ambulatory Visit (HOSPITAL_PSYCHIATRIC): Payer: Self-pay

## 2024-10-14 ENCOUNTER — Other Ambulatory Visit: Payer: Self-pay

## 2024-10-14 ENCOUNTER — Encounter (HOSPITAL_PSYCHIATRIC): Payer: Self-pay

## 2024-10-14 ENCOUNTER — Ambulatory Visit: Payer: Self-pay

## 2024-10-14 VITALS — BP 118/78 | HR 94 | Resp 18 | Ht 63.0 in | Wt 186.0 lb

## 2024-10-14 DIAGNOSIS — F4310 Post-traumatic stress disorder, unspecified: Secondary | ICD-10-CM | POA: Insufficient documentation

## 2024-10-14 DIAGNOSIS — F411 Generalized anxiety disorder: Secondary | ICD-10-CM | POA: Insufficient documentation

## 2024-10-14 DIAGNOSIS — F410 Panic disorder [episodic paroxysmal anxiety] without agoraphobia: Secondary | ICD-10-CM | POA: Insufficient documentation

## 2024-10-14 DIAGNOSIS — F3181 Bipolar II disorder: Secondary | ICD-10-CM | POA: Insufficient documentation

## 2024-10-14 DIAGNOSIS — F429 Obsessive-compulsive disorder, unspecified: Secondary | ICD-10-CM | POA: Insufficient documentation

## 2024-10-14 DIAGNOSIS — G4700 Insomnia, unspecified: Secondary | ICD-10-CM | POA: Insufficient documentation

## 2024-10-14 MED ORDER — RISPERIDONE 4 MG TABLET: 4.0000 mg | ORAL_TABLET | Freq: Every day | ORAL | 0 refills | Status: AC

## 2024-10-14 MED ORDER — LAMOTRIGINE 100 MG TABLET: 100.0000 mg | ORAL_TABLET | Freq: Two times a day (BID) | ORAL | 0 refills | Status: AC

## 2024-10-14 MED ORDER — LORAZEPAM 1 MG TABLET: 1.0000 mg | ORAL_TABLET | Freq: Three times a day (TID) | ORAL | 0 refills | Status: AC | PRN

## 2024-10-14 MED ORDER — ESCITALOPRAM 20 MG TABLET: 20.0000 mg | ORAL_TABLET | Freq: Every day | ORAL | 0 refills | Status: AC

## 2024-10-14 NOTE — Progress Notes (Signed)
 [facility]  [address]  [place] [address]  Operated by [facility]    Name: Latoya Harrison MRN:  [id number]   Date: 10/14/2024 DOB:  11-Oct-1975 (48 y.o.)       Chief Complaint: Bipolar Disorder and PTSD    Subjective:   Patient reports things has not been too too good. Middle daughter has moved out.Had got into a huge argument prior to Thanksgiving. Didn't speak for 3 weeks. They are talking now. Felt worthless at the time. Feels like anxiety is worse. Going to [place] to see oldest daughter this weekend.    Mood Ok blah  Medications Working ok. No ill effects.  Appetite Not really hungry.  Sleep Getting worse. Has difficulty falling asleep.  Energy none  Stressors Denies    Problem List[1]  Past Medical History[2]  Past Surgical History[3]  Family History[4]  Social History     Socioeconomic History    Marital status: Married   Tobacco Use    Smoking status: Every Day     Current packs/day: 1.00     Types: Cigarettes     Passive exposure: Never    Smokeless tobacco: Never   Vaping Use    Vaping status: Some Days    Substances: Nicotine, Flavoring   Substance and Sexual Activity    Alcohol  use: Not Currently     Comment: occasionally    Drug use: Never   Other Topics Concern    Ability to Walk 1 Flight of Steps without SOB/CP Yes    Routine Exercise Yes    Ability to Walk 2 Flight of Steps without SOB/CP No    Unable to Ambulate No    Total Care No    Ability To Do Own ADL's Yes    Uses Walker No    Other Activity Level Yes    Uses Cane No      Levaquin [levofloxacin] and Pertussis vaccine,adsorbed   Current Outpatient Medications   Medication Sig    atorvastatin  (LIPITOR) 10 mg Oral Tablet Take 1 Tablet (10 mg total) by mouth Daily    Blood Sugar Diagnostic (BLOOD GLUCOSE TEST) Strip Use as directed to check blood sugar twice daily and as needed.    ergocalciferol , vitamin D2, (DRISDOL ) 1,250 mcg (50,000 unit) Oral Capsule Take 1 Capsule (50,000  Units total) by mouth Every 7 days    escitalopram  oxalate (LEXAPRO ) 20 mg Oral Tablet Take 1 Tablet (20 mg total) by mouth Daily Indications: bipolar depression    famotidine  (PEPCID ) 40 mg Oral Tablet Take 1 Tablet (40 mg total) by mouth Twice daily    Ibuprofen (MOTRIN) 800 mg Oral Tablet Take 1 Tablet (800 mg total) by mouth Three times a day as needed    lamotrigine  (LAMICTAL ) 100 mg Oral Tablet Take 1 Tablet (100 mg total) by mouth Twice daily Indications: bipolar depression    lancets (UNIVERSAL 1 LANCETS) 26 gauge Misc Fingersticks 2 times daily and as needed.    LORazepam  (ATIVAN ) 1 mg Oral Tablet Take 1 Tablet (1 mg total) by mouth Twice per day as needed for Anxiety    magnesium  Oxide 500 mg magnesium  Oral Tablet Take 1 Tablet (500 mg total) by mouth Daily    meclizine  (ANTIVERT ) 25 mg Oral Tablet Take 1 Tablet (25 mg total) by mouth Every 8 hours as needed    omeprazole  (PRILOSEC) 40 mg Oral Capsule, Delayed Release(E.C.) Take 1 Capsule (40 mg total) by mouth Daily  PERCOCET 5-325 mg Oral Tablet Take 1 Tablet by mouth Every 4 hours as needed for Pain (Patient not taking: Reported on 06/20/2024)    propranoloL  (INDERAL  LA) 120 mg Oral Capsule,Sustained Action 24 hr Take 1 Capsule (120 mg total) by mouth Daily for 90 days    tirzepatide  (MOUNJARO ) 5 mg/0.5 mL Subcutaneous Pen Injector Inject 0.5 mL (5 mg total) under the skin Every 7 days Indications: type 2 diabetes mellitus        Objective :  There were no vitals taken for this visit.      PHQ Total Score                      04/24/2024    10:05 AM 05/22/2024    10:07 AM 06/20/2024    10:00 AM   Most Recent PHQ-9 Scores   PHQ 9 Total 13 17 17            Mental Status Exam  AXOX4. Casual dress, calm, well-groomed. No SI, HI, AVH, delusions, or paranoia. Thoughts are logical, coherent, and goal-directed. Good eye contact. Speech is normal in rate and tone. Mood is okay affect congruent. No psychomotor agitation or retardation, cogwheel rigidity, or  abnormal movements. Gait is normal. Attention, concentration, and memory are good. No cognitive deficits noted. Judgment and insight are fair. Calculation and abstraction are within normal limits.     Data Reviewed  I have reviewed patient's previous note medical, surgical, family, and social history in detail today,     Assessment  Bipolar II Disorder, Generalized Anxiety, Panic Disorder, PTSD, insomnia, and OCD     Plan  Escitalopram  Oxalate 20 mg take 1 tab by mouth daily for anxiety, Depression, PTSD and OCD  Increase Lorazepam  1 mg take 1 tab by mouth twice daily to three times daily as needed for anxiety and panic   Lamictal  100 mg take by mouth twice daily  Increase Risperidone  from 2 mg  to 4 mg by mouth daily  Patient advised to try Melatonin 3-6 mg as needed at bedtime. Will discuss adding sleep aides at the next appointment.   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)    PTSD (post-traumatic stress disorder)    Prediabetes    Depression    History of IBS    Chronic headaches    Cervical disc disorder    Hidradenitis suppurativa    History of anemia   [2]   Past Medical History:  Diagnosis Date    Acid reflux     Anxiety     B12 deficiency     Back problem     Benign paroxysmal positional vertigo     Cervical disc disease     Chronic headaches     Depression     Diarrhea     GAD (generalized anxiety disorder)     History of IBS     Iron deficiency anemia, unspecified  Irritable bowel syndrome     Low magnesium  level     Neck problem     OCD (obsessive compulsive disorder)     Panic attacks     Prediabetes     PTSD  (post-traumatic stress disorder)     TMJ (dislocation of temporomandibular joint)     Type 2 diabetes mellitus     Vitamin B 12 deficiency     Vitamin D  deficiency     Wears glasses    [3]   Past Surgical History:  Procedure Laterality Date    COLONOSCOPY N/A 2021    Dr.Patel    ESOPHAGOGASTRODUODENOSCOPY  2021    HX HYSTERECTOMY N/A 07/2020    HX TUBAL LIGATION     [4]   Family History  Problem Relation Name Age of Onset    Hypertension (High Blood Pressure) Mother      Anxiety Mother      Diabetes Mother      Depression Mother      Anxiety Father      Depression Father      Diabetes type II Father      Alcohol  abuse Father      Melanoma Father      Anxiety Brother      Depression Brother      Other Brother          SUD on methadone     Suicidality Maternal Uncle      Hypertension (High Blood Pressure) Maternal Grandmother      Alcohol  abuse Maternal Grandfather      Hypertension (High Blood Pressure) Paternal Grandmother      Alcohol  abuse Paternal Grandfather      Anxiety Daughter          age 42 in 2023    No Known Problems Daughter          Age 56 in 2023    No Known Problems Daughter          age 42 in 2023    Coronary Artery Disease Other          Maternal uncle

## 2024-11-11 ENCOUNTER — Ambulatory Visit (INDEPENDENT_AMBULATORY_CARE_PROVIDER_SITE_OTHER): Payer: Self-pay | Admitting: PHYSICIAN ASSISTANT

## 2024-11-12 ENCOUNTER — Ambulatory Visit: Payer: Self-pay
